# Patient Record
Sex: Male | Born: 1965 | Race: White | Hispanic: No
Health system: Southern US, Community
[De-identification: ages and names within clinical notes are randomized; demographics above are authoritative.]

## PROBLEM LIST (undated history)

## (undated) DIAGNOSIS — K219 Gastro-esophageal reflux disease without esophagitis: Secondary | ICD-10-CM

## (undated) DIAGNOSIS — M1990 Unspecified osteoarthritis, unspecified site: Secondary | ICD-10-CM

## (undated) DIAGNOSIS — Z87442 Personal history of urinary calculi: Secondary | ICD-10-CM

## (undated) DIAGNOSIS — M1711 Unilateral primary osteoarthritis, right knee: Secondary | ICD-10-CM

---

## 2001-05-31 HISTORY — PX: APPENDECTOMY: SHX54

## 2001-12-19 ENCOUNTER — Emergency Department (HOSPITAL_COMMUNITY): Admission: EM | Admit: 2001-12-19 | Discharge: 2001-12-20 | Payer: Self-pay | Admitting: Emergency Medicine

## 2007-02-23 ENCOUNTER — Ambulatory Visit: Payer: Self-pay | Admitting: Gastroenterology

## 2011-08-26 ENCOUNTER — Ambulatory Visit: Payer: Self-pay | Admitting: Orthopedic Surgery

## 2012-10-20 ENCOUNTER — Ambulatory Visit: Payer: Self-pay | Admitting: Orthopedic Surgery

## 2013-03-26 ENCOUNTER — Ambulatory Visit: Payer: Self-pay | Admitting: Physician Assistant

## 2013-05-31 HISTORY — PX: FINGER SURGERY: SHX640

## 2013-06-26 ENCOUNTER — Ambulatory Visit: Payer: Self-pay | Admitting: Orthopedic Surgery

## 2013-07-09 ENCOUNTER — Ambulatory Visit: Payer: Self-pay | Admitting: Orthopedic Surgery

## 2014-04-20 DIAGNOSIS — E7800 Pure hypercholesterolemia, unspecified: Secondary | ICD-10-CM | POA: Insufficient documentation

## 2014-04-20 DIAGNOSIS — K7581 Nonalcoholic steatohepatitis (NASH): Secondary | ICD-10-CM | POA: Insufficient documentation

## 2014-09-21 NOTE — Op Note (Signed)
PATIENT NAME:  Patrick Peterson, Patrick Peterson MR#:  [id number] DATE OF BIRTH:  Jun 11, 1965  DATE OF PROCEDURE:  06/26/2013  PREOPERATIVE DIAGNOSIS: Right index finger foreign body, middle phalanx.   POSTOPERATIVE DIAGNOSIS:  Right index finger foreign body, middle phalanx.   ANESTHESIA: General.   SURGEON: Hessie Knows, M.D.   DESCRIPTION OF PROCEDURE: The patient was brought to the operating room and after adequate anesthesia was obtained, the right arm was prepped and draped in the usual sterile fashion. A tourniquet applied to the upper forearm. After patient identification and timeout procedures were completed, the tourniquet was raised to 250 mmHg. An incision was then made over the dorsum of the middle phalanx with a C-shaped incision. The flap was then sutured down and the extensor tendon split longitudinally directly over the foreign body based on fluoroscopy. A 2.0 mm bur was then used to open up the dorsal cortex and the small metal fragment could be identified. Great care was taken to loosen it from its adjacent tissue and it was then removed with minimal bone loss dorsally. The wound was then irrigated and AlloMatrix bone putty placed in the defect in the phalanx. The foreign body had gone all the way to the flexor tendon surface volarly. After removal, the C-arm showed complete removal of the metal fragment. The tendon was repaired using 6-0 Monocryl, skin closed with 4-0 nylon in a simple interrupted fashion. A digital block with 10 mL of 0.5% Sensorcaine were utilized. Dressing of Xeroform, 4 x 4 and a 1 inch Kling were applied. The patient was then sent to the recovery room in stable condition.   ESTIMATED BLOOD LOSS: Minimal.   COMPLICATIONS: None.   SPECIMENS: None. Foreign body was sent with the patient. It appeared to be the end of a chisel.   TOURNIQUET TIME: 24 minutes at 250 mmHg.  ____________________________ Laurene Footman, MD [id number] D: 06/26/2013 09:56:59 ET T: 06/26/2013  10:25:52 ET JOB#: [id number]  cc: Laurene Footman, MD, <Dictator> Laurene Footman MD ELECTRONICALLY SIGNED 06/26/2013 11:03

## 2016-02-25 DIAGNOSIS — M159 Polyosteoarthritis, unspecified: Secondary | ICD-10-CM | POA: Insufficient documentation

## 2016-07-02 DIAGNOSIS — J40 Bronchitis, not specified as acute or chronic: Secondary | ICD-10-CM | POA: Diagnosis not present

## 2016-07-02 DIAGNOSIS — J329 Chronic sinusitis, unspecified: Secondary | ICD-10-CM | POA: Diagnosis not present

## 2016-08-02 DIAGNOSIS — Z0000 Encounter for general adult medical examination without abnormal findings: Secondary | ICD-10-CM | POA: Diagnosis not present

## 2016-08-02 DIAGNOSIS — K7581 Nonalcoholic steatohepatitis (NASH): Secondary | ICD-10-CM | POA: Diagnosis not present

## 2016-08-02 DIAGNOSIS — E7800 Pure hypercholesterolemia, unspecified: Secondary | ICD-10-CM | POA: Diagnosis not present

## 2016-10-03 DIAGNOSIS — J069 Acute upper respiratory infection, unspecified: Secondary | ICD-10-CM | POA: Diagnosis not present

## 2016-10-03 DIAGNOSIS — J019 Acute sinusitis, unspecified: Secondary | ICD-10-CM | POA: Diagnosis not present

## 2016-10-14 DIAGNOSIS — M25562 Pain in left knee: Secondary | ICD-10-CM | POA: Diagnosis not present

## 2016-12-29 ENCOUNTER — Emergency Department: Payer: 59

## 2016-12-29 ENCOUNTER — Emergency Department
Admission: EM | Admit: 2016-12-29 | Discharge: 2016-12-29 | Disposition: A | Discharge status: Home / Self Care | Payer: 59 | Source: Home / Self Care | Attending: Emergency Medicine | Admitting: Emergency Medicine

## 2016-12-29 DIAGNOSIS — K8020 Calculus of gallbladder without cholecystitis without obstruction: Secondary | ICD-10-CM | POA: Diagnosis not present

## 2016-12-29 DIAGNOSIS — R1013 Epigastric pain: Secondary | ICD-10-CM | POA: Insufficient documentation

## 2016-12-29 DIAGNOSIS — R748 Abnormal levels of other serum enzymes: Secondary | ICD-10-CM | POA: Diagnosis not present

## 2016-12-29 DIAGNOSIS — K8050 Calculus of bile duct without cholangitis or cholecystitis without obstruction: Secondary | ICD-10-CM

## 2016-12-29 DIAGNOSIS — R0602 Shortness of breath: Secondary | ICD-10-CM | POA: Diagnosis not present

## 2016-12-29 DIAGNOSIS — F17200 Nicotine dependence, unspecified, uncomplicated: Secondary | ICD-10-CM | POA: Insufficient documentation

## 2016-12-29 DIAGNOSIS — K8000 Calculus of gallbladder with acute cholecystitis without obstruction: Secondary | ICD-10-CM | POA: Diagnosis not present

## 2016-12-29 LAB — COMPREHENSIVE METABOLIC PANEL
ALBUMIN: 4.4 g/dL (ref 3.5–5.0)
ALK PHOS: 93 U/L (ref 38–126)
ALT: 170 U/L — AB (ref 17–63)
AST: 197 U/L — AB (ref 15–41)
Anion gap: 9 (ref 5–15)
BUN: 14 mg/dL (ref 6–20)
CALCIUM: 10 mg/dL (ref 8.9–10.3)
CHLORIDE: 104 mmol/L (ref 101–111)
CO2: 26 mmol/L (ref 22–32)
CREATININE: 0.98 mg/dL (ref 0.61–1.24)
GFR calc non Af Amer: >60 mL/min (ref >60)
GLUCOSE: 124 mg/dL — AB (ref 65–99)
Potassium: 4.8 mmol/L (ref 3.5–5.1)
SODIUM: 139 mmol/L (ref 135–145)
Total Bilirubin: 6.9 mg/dL — ABNORMAL HIGH (ref 0.3–1.2)
Total Protein: 7.5 g/dL (ref 6.5–8.1)

## 2016-12-29 LAB — CBC
HEMATOCRIT: 50.7 % (ref 40.0–52.0)
HEMOGLOBIN: 17.8 g/dL (ref 13.0–18.0)
MCH: 31.8 pg (ref 26.0–34.0)
MCHC: 35 g/dL (ref 32.0–36.0)
MCV: 90.9 fL (ref 80.0–100.0)
Platelets: 178 10*3/uL (ref 150–440)
RBC: 5.58 MIL/uL (ref 4.40–5.90)
RDW: 13.5 % (ref 11.5–14.5)
WBC: 10 10*3/uL (ref 3.8–10.6)

## 2016-12-29 LAB — LIPASE, BLOOD: Lipase: 39 U/L (ref 11–51)

## 2016-12-29 LAB — TROPONIN I

## 2016-12-29 MED ORDER — MORPHINE SULFATE (PF) 2 MG/ML IV SOLN
INTRAVENOUS | Status: AC
  Administered 2016-12-29: 2 mg via INTRAVENOUS
  Filled 2016-12-29: qty 1

## 2016-12-29 MED ORDER — GI COCKTAIL ~~LOC~~
30.0000 mL | ORAL | Status: AC
  Administered 2016-12-29: 30 mL via ORAL

## 2016-12-29 MED ORDER — ONDANSETRON HCL 4 MG/2ML IJ SOLN
INTRAMUSCULAR | Status: AC
  Filled 2016-12-29: qty 2

## 2016-12-29 MED ORDER — MORPHINE SULFATE (PF) 2 MG/ML IV SOLN
2.0000 mg | Freq: Once | INTRAVENOUS | Status: AC
  Administered 2016-12-29: 2 mg via INTRAVENOUS

## 2016-12-29 MED ORDER — ONDANSETRON HCL 4 MG/2ML IJ SOLN: 4.0000 mg | Freq: Once | INTRAMUSCULAR | Status: DC

## 2016-12-29 MED ORDER — GI COCKTAIL ~~LOC~~
ORAL | Status: AC
  Administered 2016-12-29: 30 mL via ORAL
  Filled 2016-12-29: qty 30

## 2016-12-29 NOTE — ED Notes (Signed)
Pt stating that his pain is now gone and that he feels better. Pt alert and oriented X4, active, cooperative, pt in NAD. RR even and unlabored, color WNL.

## 2016-12-29 NOTE — Discharge Instructions (Signed)
As we discussed please follow-up your doctor on Friday for repeat a few labs. If he develops any further abdominal pain or any fever please return immediately to the emergency department for evaluation.

## 2016-12-29 NOTE — ED Triage Notes (Signed)
Pt arrives to ER via POV c/o epigastric pain that started this AM approx 1 hour after eating. Pt states pain radiates to back. Pt appears to be in pain, grimacing.

## 2016-12-29 NOTE — ED Notes (Signed)
Pt in US

## 2016-12-29 NOTE — ED Notes (Signed)
Pt states had central abd pain that has resolved at this time. Denies N&V&D. Alert, oriented.

## 2016-12-29 NOTE — ED Provider Notes (Signed)
[facility] Emergency Department Provider Note  Time seen: 2:14 PM  I have reviewed the triage vital signs and the nursing notes.   HISTORY  Chief Complaint Abdominal Pain    HPI Patrick Peterson is a 51 y.o. male with no past medical history presents to the emergency department for epigastric pain. According to the patient he ate breakfast this morning around 8:30 9:00 developed significant epigastric pain. States he was getting to the point where he was having some difficulty taking a deep breath due to abdominal pain. Denies any history of epigastric right upper quadrant pain previously. Denies any pain after eating previously. Denies any fever. Denies any nausea or vomiting. Patient received a GI cocktail in triage upon arrival to the emergency department and states complete resolution of his pain after GI cocktail. Denies any regular alcohol use.  History reviewed. No pertinent past medical history.  There are no active problems to display for this patient.   History reviewed. No pertinent surgical history.  Prior to Admission medications   Not on File    No Known Allergies  No family history on file.  Social History Social History  Substance Use Topics  . Smoking status: Current Every Day Smoker  . Smokeless tobacco: Not on file  . Alcohol use Yes    Review of Systems Constitutional: Negative for fever. Cardiovascular: Negative for chest pain. Respiratory: Negative for shortness of breath. Gastrointestinal: Significant epigastric pain, now resolved. Patient states it was aching in nature when it was occurring. Musculoskeletal: Some radiation to the back, now resolved All other ROS negative  ____________________________________________   PHYSICAL EXAM:  VITAL SIGNS: ED Triage Vitals  Enc Vitals Group     BP 12/29/16 1146 (!) 153/87     Pulse Rate 12/29/16 1146 (!) 55     Resp 12/29/16 1146 (!) 24     Temp 12/29/16 1146 97.6 F  (36.4 C)     Temp Source 12/29/16 1146 Oral     SpO2 12/29/16 1146 97 %     Weight 12/29/16 1148 285 lb (129.3 kg)     Height 12/29/16 1148 6\' 3"  (1.905 m)     Head Circumference --      Peak Flow --      Pain Score 12/29/16 1154 10     Pain Loc --      Pain Edu? --      Excl. in [place]? --     Constitutional: Alert and oriented. Well appearing and in no distress. Eyes: Normal exam ENT   Head: Normocephalic and atraumatic   Mouth/Throat: Mucous membranes are moist. Cardiovascular: Normal rate, regular rhythm. No murmur Respiratory: Normal respiratory effort without tachypnea nor retractions. Breath sounds are clear Gastrointestinal: Soft and nontender. No distention.  No abdominal tenderness palpation. Musculoskeletal: Nontender with normal range of motion in all extremities.  Neurologic:  Normal speech and language. No gross focal neurologic deficits  Skin:  Skin is warm, dry and intact.  Psychiatric: Mood and affect are normal.   ____________________________________________    EKG  EKG reviewed and interpreted by myself shows sinus bradycardia at 58 bpm, narrow QRS, normal axis, normal intervals, no ST changes. Normal EKG.  ____________________________________________    RADIOLOGY  Chest x-ray negative  IMPRESSION: Gallstone adherent in neck of gallbladder. No gallbladder wall thickening or pericholecystic fluid.  Increased liver echogenicity, a finding most likely indicative of hepatic steatosis. While no focal liver lesions are evident on this study, it must be  cautioned that the sensitivity of ultrasound for detection of focal liver lesions is diminished in this circumstance.  ____________________________________________   INITIAL IMPRESSION / ASSESSMENT AND PLAN / ED COURSE  Pertinent labs & imaging results that were available during my care of the patient were reviewed by me and considered in my medical decision making (see chart for  details).  Patient presents to the emergency department for epigastric pain which started one hour after eating breakfast this morning. Patient has a completely nontender exam. Takes the pain is completely resolved at this time. Patient's labs show significant LFT elevation as well as a significant elevation of total bilirubin. We will obtain a right upper quadrant ultrasound to further evaluate. Patient is agreeable to this plan. Lipase is normal.  Ultrasound shows a gallstone in the neck of the gallbladder. No gallbladder wall thickening or prior cholecystic fluid. Patient states his abdominal pain is completely gone at this time. Nontender to palpation. Given the patient's elevated bilirubinemia suspect the patient likely had a common duct stone which hopefully his past spontaneously given his complete resolution of abdominal pain. Patient has a primary care doctor he sees a recommended he follow-up tomorrow or Friday for repeat of his labs. If his abdominal pain returns or he spikes a fever he is to return immediately to the emergency department. Patient agreeable to this plan.     ____________________________________________   FINAL CLINICAL IMPRESSION(S) / ED DIAGNOSES  Epigastric pain Biliary colic   Harvest Dark, MD 12/29/16 1551

## 2016-12-29 NOTE — ED Notes (Signed)
ED Provider at bedside. 

## 2016-12-30 ENCOUNTER — Emergency Department: Payer: 59

## 2016-12-30 ENCOUNTER — Inpatient Hospital Stay
Admission: EM | Admit: 2016-12-30 | Discharge: 2017-01-03 | DRG: 419 | Disposition: A | Discharge status: Home / Self Care | Payer: 59 | Attending: Surgery | Admitting: Surgery

## 2016-12-30 ENCOUNTER — Encounter: Payer: Self-pay | Admitting: Emergency Medicine

## 2016-12-30 DIAGNOSIS — R17 Unspecified jaundice: Secondary | ICD-10-CM | POA: Diagnosis not present

## 2016-12-30 DIAGNOSIS — R0602 Shortness of breath: Secondary | ICD-10-CM | POA: Diagnosis not present

## 2016-12-30 DIAGNOSIS — K760 Fatty (change of) liver, not elsewhere classified: Secondary | ICD-10-CM | POA: Diagnosis present

## 2016-12-30 DIAGNOSIS — R7401 Elevation of levels of liver transaminase levels: Secondary | ICD-10-CM | POA: Diagnosis present

## 2016-12-30 DIAGNOSIS — K8020 Calculus of gallbladder without cholecystitis without obstruction: Secondary | ICD-10-CM | POA: Diagnosis not present

## 2016-12-30 DIAGNOSIS — R740 Nonspecific elevation of levels of transaminase and lactic acid dehydrogenase [LDH]: Secondary | ICD-10-CM | POA: Diagnosis present

## 2016-12-30 DIAGNOSIS — F17200 Nicotine dependence, unspecified, uncomplicated: Secondary | ICD-10-CM | POA: Diagnosis present

## 2016-12-30 DIAGNOSIS — K810 Acute cholecystitis: Secondary | ICD-10-CM | POA: Diagnosis not present

## 2016-12-30 DIAGNOSIS — K828 Other specified diseases of gallbladder: Secondary | ICD-10-CM | POA: Diagnosis present

## 2016-12-30 DIAGNOSIS — R109 Unspecified abdominal pain: Secondary | ICD-10-CM | POA: Diagnosis not present

## 2016-12-30 DIAGNOSIS — R1013 Epigastric pain: Secondary | ICD-10-CM | POA: Diagnosis not present

## 2016-12-30 DIAGNOSIS — M170 Bilateral primary osteoarthritis of knee: Secondary | ICD-10-CM | POA: Diagnosis present

## 2016-12-30 DIAGNOSIS — Z87442 Personal history of urinary calculi: Secondary | ICD-10-CM | POA: Diagnosis not present

## 2016-12-30 DIAGNOSIS — R1011 Right upper quadrant pain: Secondary | ICD-10-CM

## 2016-12-30 DIAGNOSIS — K8010 Calculus of gallbladder with chronic cholecystitis without obstruction: Secondary | ICD-10-CM | POA: Diagnosis not present

## 2016-12-30 DIAGNOSIS — R930 Abnormal findings on diagnostic imaging of skull and head, not elsewhere classified: Secondary | ICD-10-CM | POA: Diagnosis not present

## 2016-12-30 DIAGNOSIS — R748 Abnormal levels of other serum enzymes: Secondary | ICD-10-CM | POA: Diagnosis not present

## 2016-12-30 DIAGNOSIS — K8000 Calculus of gallbladder with acute cholecystitis without obstruction: Secondary | ICD-10-CM | POA: Diagnosis not present

## 2016-12-30 DIAGNOSIS — R1010 Upper abdominal pain, unspecified: Secondary | ICD-10-CM

## 2016-12-30 LAB — CBC
HCT: 47.3 % (ref 40.0–52.0)
HEMOGLOBIN: 16.3 g/dL (ref 13.0–18.0)
MCH: 31.1 pg (ref 26.0–34.0)
MCHC: 34.5 g/dL (ref 32.0–36.0)
MCV: 90.3 fL (ref 80.0–100.0)
PLATELETS: 175 10*3/uL (ref 150–440)
RBC: 5.24 MIL/uL (ref 4.40–5.90)
RDW: 13.6 % (ref 11.5–14.5)
WBC: 7.6 10*3/uL (ref 3.8–10.6)

## 2016-12-30 LAB — COMPREHENSIVE METABOLIC PANEL
ALK PHOS: 108 U/L (ref 38–126)
ALT: 253 U/L — ABNORMAL HIGH (ref 17–63)
ANION GAP: 8 (ref 5–15)
AST: 213 U/L — ABNORMAL HIGH (ref 15–41)
Albumin: 4.3 g/dL (ref 3.5–5.0)
BUN: 14 mg/dL (ref 6–20)
CALCIUM: 9.3 mg/dL (ref 8.9–10.3)
CHLORIDE: 105 mmol/L (ref 101–111)
CO2: 24 mmol/L (ref 22–32)
CREATININE: 0.86 mg/dL (ref 0.61–1.24)
Glucose, Bld: 117 mg/dL — ABNORMAL HIGH (ref 65–99)
Potassium: 3.8 mmol/L (ref 3.5–5.1)
SODIUM: 137 mmol/L (ref 135–145)
Total Bilirubin: 8.7 mg/dL — ABNORMAL HIGH (ref 0.3–1.2)
Total Protein: 7.4 g/dL (ref 6.5–8.1)

## 2016-12-30 LAB — URINALYSIS, COMPLETE (UACMP) WITH MICROSCOPIC
BILIRUBIN URINE: NEGATIVE
GLUCOSE, UA: NEGATIVE mg/dL
HGB URINE DIPSTICK: NEGATIVE
KETONES UR: NEGATIVE mg/dL
LEUKOCYTES UA: NEGATIVE
Nitrite: NEGATIVE
PH: 7 (ref 5.0–8.0)
PROTEIN: NEGATIVE mg/dL
Specific Gravity, Urine: 1.012 (ref 1.005–1.030)
Squamous Epithelial / LPF: NONE SEEN

## 2016-12-30 LAB — PROTIME-INR
INR: 1.02
PROTHROMBIN TIME: 13.4 s (ref 11.4–15.2)

## 2016-12-30 LAB — LIPASE, BLOOD: LIPASE: 49 U/L (ref 11–51)

## 2016-12-30 LAB — ACETAMINOPHEN LEVEL

## 2016-12-30 LAB — TROPONIN I: Troponin I: <0.03 ng/mL (ref <0.03)

## 2016-12-30 MED ORDER — SODIUM CHLORIDE 0.9 % IV SOLN
INTRAVENOUS | Status: DC
  Administered 2016-12-30: 23:00:00 via INTRAVENOUS

## 2016-12-30 MED ORDER — HEPARIN SODIUM (PORCINE) 5000 UNIT/ML IJ SOLN
5000.0000 [IU] | Freq: Three times a day (TID) | INTRAMUSCULAR | Status: DC
  Administered 2016-12-30 – 2017-01-02 (×6): 5000 [IU] via SUBCUTANEOUS
  Filled 2016-12-30 (×6): qty 1

## 2016-12-30 MED ORDER — BISACODYL 5 MG PO TBEC: 5.0000 mg | DELAYED_RELEASE_TABLET | Freq: Every day | ORAL | Status: DC | PRN

## 2016-12-30 MED ORDER — ACETAMINOPHEN 650 MG RE SUPP: 650.0000 mg | Freq: Four times a day (QID) | RECTAL | Status: DC | PRN

## 2016-12-30 MED ORDER — HYDROCODONE-ACETAMINOPHEN 5-325 MG PO TABS
1.0000 | ORAL_TABLET | ORAL | Status: DC | PRN
  Administered 2017-01-02: 1 via ORAL
  Administered 2017-01-02: 2 via ORAL
  Administered 2017-01-02 (×2): 1 via ORAL
  Administered 2017-01-02 – 2017-01-03 (×2): 2 via ORAL
  Filled 2016-12-30: qty 2
  Filled 2016-12-30: qty 1
  Filled 2016-12-30: qty 2
  Filled 2016-12-30: qty 1
  Filled 2016-12-30: qty 2
  Filled 2016-12-30: qty 1

## 2016-12-30 MED ORDER — ONDANSETRON HCL 4 MG/2ML IJ SOLN
4.0000 mg | Freq: Four times a day (QID) | INTRAMUSCULAR | Status: DC | PRN
  Administered 2017-01-02 (×2): 4 mg via INTRAVENOUS
  Filled 2016-12-30: qty 2

## 2016-12-30 MED ORDER — SODIUM CHLORIDE 0.9 % IV BOLUS (SEPSIS)
1000.0000 mL | Freq: Once | INTRAVENOUS | Status: AC
  Administered 2016-12-30: 1000 mL via INTRAVENOUS

## 2016-12-30 MED ORDER — ONDANSETRON HCL 4 MG PO TABS: 4.0000 mg | ORAL_TABLET | Freq: Four times a day (QID) | ORAL | Status: DC | PRN

## 2016-12-30 MED ORDER — DOCUSATE SODIUM 100 MG PO CAPS
100.0000 mg | ORAL_CAPSULE | Freq: Two times a day (BID) | ORAL | Status: DC
  Administered 2016-12-30 – 2017-01-02 (×3): 100 mg via ORAL
  Filled 2016-12-30 (×4): qty 1

## 2016-12-30 MED ORDER — GADOBENATE DIMEGLUMINE 529 MG/ML IV SOLN
20.0000 mL | Freq: Once | INTRAVENOUS | Status: AC | PRN
  Administered 2016-12-30: 20 mL via INTRAVENOUS

## 2016-12-30 MED ORDER — ACETAMINOPHEN 325 MG PO TABS
650.0000 mg | ORAL_TABLET | Freq: Four times a day (QID) | ORAL | Status: DC | PRN
Start: 2016-12-30 — End: 2017-01-03

## 2016-12-30 NOTE — ED Notes (Addendum)
Pt has pain in upper abdomen.  Pt reports nausea.   No v/d.  Pt was seen here yesterday with similar sx.  Pt also reports dark urine.    Pt reports he has gallstones.

## 2016-12-30 NOTE — ED Notes (Signed)
Report off to kala rn 

## 2016-12-30 NOTE — ED Provider Notes (Signed)
Mr 3d Recon At Scanner  Result Date: 12/30/2016 CLINICAL DATA:  Epigastric/ upper abdominal pain, cholelithiasis EXAM: MRI ABDOMEN WITHOUT AND WITH CONTRAST (INCLUDING MRCP) TECHNIQUE: Multiplanar multisequence MR imaging of the abdomen was performed both before and after the administration of intravenous contrast. Heavily T2-weighted images of the biliary and pancreatic ducts were obtained, and three-dimensional MRCP images were rendered by post processing. CONTRAST:  52mL MULTIHANCE GADOBENATE DIMEGLUMINE 529 MG/ML IV SOLN COMPARISON:  Right upper quadrant ultrasound dated 12/30/2016 FINDINGS: Lower chest: Lung bases are clear. Hepatobiliary: Moderate hepatic steatosis. No suspicious/enhancing hepatic lesions. Layering small gallstones measuring up to 10 mm (series 4/ image 15). No associated inflammatory changes. No intrahepatic or extrahepatic ductal dilatation. Common duct measures 5 mm. No choledocholithiasis is seen. Pancreas:  Within normal limits. Spleen:  Within normal limits. Adrenals/Urinary Tract:  Adrenal glands are within normal limits. Kidneys are within normal limits.  No hydronephrosis. Stomach/Bowel: Stomach is within normal limits. Visualized bowel is unremarkable. Vascular/Lymphatic:  No evidence of abdominal aortic aneurysm. No suspicious abdominal lymphadenopathy. Other:  No abdominal ascites. Musculoskeletal: No focal osseous lesions IMPRESSION: Cholelithiasis, without associated inflammatory changes. No intrahepatic or extrahepatic ductal dilatation. Common duct measures 5 mm. No choledocholithiasis is seen. Moderate hepatic steatosis. Electronically Signed   By: Julian Hy M.D.   On: 12/30/2016 21:14   Mr Abdomen Mrcp Moise Boring Contast  Result Date: 12/30/2016 CLINICAL DATA:  Epigastric/ upper abdominal pain, cholelithiasis EXAM: MRI ABDOMEN WITHOUT AND WITH CONTRAST (INCLUDING MRCP) TECHNIQUE: Multiplanar multisequence MR imaging of the abdomen was performed both before and after the  administration of intravenous contrast. Heavily T2-weighted images of the biliary and pancreatic ducts were obtained, and three-dimensional MRCP images were rendered by post processing. CONTRAST:  81mL MULTIHANCE GADOBENATE DIMEGLUMINE 529 MG/ML IV SOLN COMPARISON:  Right upper quadrant ultrasound dated 12/30/2016 FINDINGS: Lower chest: Lung bases are clear. Hepatobiliary: Moderate hepatic steatosis. No suspicious/enhancing hepatic lesions. Layering small gallstones measuring up to 10 mm (series 4/ image 15). No associated inflammatory changes. No intrahepatic or extrahepatic ductal dilatation. Common duct measures 5 mm. No choledocholithiasis is seen. Pancreas:  Within normal limits. Spleen:  Within normal limits. Adrenals/Urinary Tract:  Adrenal glands are within normal limits. Kidneys are within normal limits.  No hydronephrosis. Stomach/Bowel: Stomach is within normal limits. Visualized bowel is unremarkable. Vascular/Lymphatic:  No evidence of abdominal aortic aneurysm. No suspicious abdominal lymphadenopathy. Other:  No abdominal ascites. Musculoskeletal: No focal osseous lesions IMPRESSION: Cholelithiasis, without associated inflammatory changes. No intrahepatic or extrahepatic ductal dilatation. Common duct measures 5 mm. No choledocholithiasis is seen. Moderate hepatic steatosis. Electronically Signed   By: Julian Hy M.D.   On: 12/30/2016 21:14   US Abdomen Limited Ruq  Result Date: 12/30/2016 CLINICAL DATA:  Right upper quadrant pain x3 days EXAM: ULTRASOUND ABDOMEN LIMITED RIGHT UPPER QUADRANT COMPARISON:  12/29/2016 FINDINGS: Gallbladder: Multiple gallstones measuring up to 11 mm. No gallbladder wall thickening or pericholecystic fluid. Negative sonographic Murphy's sign. Common bile duct: Diameter: 5 mm Liver: Hyperechoic hepatic parenchyma.  No focal hepatic lesion is seen. IMPRESSION: Cholelithiasis, without associated inflammatory changes to suggest acute cholecystitis. Suspected hepatic  steatosis. Electronically Signed   By: Julian Hy M.D.   On: 12/30/2016 17:29       ----------------------------------------- 9:39 PM on 12/30/2016 -----------------------------------------  Patient is awake and alert. MRCP findings reviewed. Discussed with patient and he is agreeable with plan for admission here. I spoke with Dr. Vicente Males gastroenterology who recommends admission at [facility] and he will see in  consult in the morning. In addition spoke with Dr. Adonis Huguenin surgery, recommends admission here in perform surgery consult but the patient is not a surgical candidate at this time. No indication for transfer or ERCP per Dr. Vicente Males at this time. Recommends admit and add INR for now.     Delman Kitten, MD 12/30/16 2140

## 2016-12-30 NOTE — ED Triage Notes (Signed)
Pt here for epigastric/upper abdominal pain. Here yesterday and was told it was his gallbladder.  Pain was worse after eating so has not eaten.  No vomiting. Has had nausea.  Did start to see hematuria. Elevated liver function yesterday.  No fevers.

## 2016-12-30 NOTE — ED Provider Notes (Signed)
[facility] Emergency Department Provider Note  ____________________________________________   First MD Initiated Contact with Patient 12/30/16 1614     (approximate)  I have reviewed the triage vital signs and the nursing notes.   HISTORY  Chief Complaint Abdominal Pain   HPI Patrick Peterson is a 51 y.o. male without any chronic medical problems was presenting to the emergency department today with epigastric abdominal pain. He was seen her yesterday and found to have elevated bilirubin in addition to transaminases. He had an ultrasound of his right upper quadrant which showed a single stone adhered to the gallbladder neck. However, he was pain-free upon reevaluation yesterday and was discharged home for outpatient follow-up. However, today after eating lunch the patient began to have pain once again with nausea. He return to the emergency department for further evaluation. He says that he now remembers several weeks ago having pain after eating which was similar which likely represent at the onset of his issue.  Also today complaining of fatigue colored urine.   History reviewed. No pertinent past medical history.  There are no active problems to display for this patient.   History reviewed. No pertinent surgical history.  Prior to Admission medications   Not on File    Allergies Patient has no known allergies.  History reviewed. No pertinent family history.  Social History Social History  Substance Use Topics  . Smoking status: Current Every Day Smoker  . Smokeless tobacco: Never Used  . Alcohol use Yes    Review of Systems  Constitutional: No fever/chills Eyes: No visual changes. ENT: No sore throat. Cardiovascular: Denies chest pain. Respiratory: Denies shortness of breath. Gastrointestinal: no vomiting.  No diarrhea.  No constipation. Genitourinary: as above Musculoskeletal: Negative for back pain. Skin: Negative for  rash. Neurological: Negative for headaches, focal weakness or numbness.   ____________________________________________   PHYSICAL EXAM:  VITAL SIGNS: ED Triage Vitals  Enc Vitals Group     BP 12/30/16 1606 (!) 153/89     Pulse Rate 12/30/16 1606 69     Resp 12/30/16 1606 18     Temp 12/30/16 1606 98.8 F (37.1 C)     Temp Source 12/30/16 1606 Oral     SpO2 12/30/16 1606 97 %     Weight 12/30/16 1606 285 lb (129.3 kg)     Height 12/30/16 1606 6\' 3"  (1.905 m)     Head Circumference --      Peak Flow --      Pain Score 12/30/16 1605 2     Pain Loc --      Pain Edu? --      Excl. in [place]? --     Constitutional: Alert and oriented. Well appearing and in no acute distress. Eyes: Conjunctivae are icteric  Head: Atraumatic. Nose: No congestion/rhinnorhea. Mouth/Throat: Mucous membranes are moist.  Neck: No stridor.   Cardiovascular: Normal rate, regular rhythm. Grossly normal heart sounds.   Respiratory: Normal respiratory effort.  No retractions. Lungs CTAB. Gastrointestinal: Soft and nontender. No distention. No CVA tenderness.Negative Murphy sign. Musculoskeletal: No lower extremity tenderness nor edema.  No joint effusions. Neurologic:  Normal speech and language. No gross focal neurologic deficits are appreciated. Skin:  Skin is warm, dry and intact. No rash noted. Psychiatric: Mood and affect are normal. Speech and behavior are normal.  ____________________________________________   LABS (all labs ordered are listed, but only abnormal results are displayed)  Labs Reviewed  COMPREHENSIVE METABOLIC PANEL - Abnormal; Notable for  the following:       Result Value   Glucose, Bld 117 (*)    AST 213 (*)    ALT 253 (*)    Total Bilirubin 8.7 (*)    All other components within normal limits  URINALYSIS, COMPLETE (UACMP) WITH MICROSCOPIC - Abnormal; Notable for the following:    Color, Urine AMBER (*)    APPearance CLEAR (*)    Bacteria, UA RARE (*)    All other  components within normal limits  LIPASE, BLOOD  CBC  TROPONIN I   ____________________________________________  EKG   ____________________________________________  RADIOLOGY  Ultrasound with cholelithiasis but without any associated inflammatory changes. Normal common bile duct. Hepatic steatosis. ____________________________________________   PROCEDURES  Procedure(s) performed:   Procedures  Critical Care performed:   ____________________________________________   INITIAL IMPRESSION / ASSESSMENT AND PLAN / ED COURSE  Pertinent labs & imaging results that were available during my care of the patient were reviewed by me and considered in my medical decision making (see chart for details).  ----------------------------------------- 8:57 PM on 12/30/2016 -----------------------------------------  Patient continues to be pain-free. Discussed the case with surgery, Dr. Hampton Abbot and Adonis Huguenin, who recommended that as long as the MRCP is negative that the patient will need to be admitted to medicine for medical workup and they will follow as consultants.  Plan is that the patient has a negative MRCP that he will be admitted to the hospital here. However, there is a stone found in the common bile duct and the patient will need to be transferred. Patient aware of the plan. Signed out to Dr. Jacqualine Code.        ____________________________________________   FINAL CLINICAL IMPRESSION(S) / ED DIAGNOSES  Final diagnoses:  RUQ pain  Transaminitis. Hyperbilirubinemia.    NEW MEDICATIONS STARTED DURING THIS VISIT:  New Prescriptions   No medications on file     Note:  This document was prepared using Dragon voice recognition software and may include unintentional dictation errors.     Orbie Pyo, MD 12/30/16 2059

## 2016-12-30 NOTE — ED Notes (Signed)
Return from u/s  Pt alert   Family with pt.

## 2016-12-30 NOTE — H&P (Signed)
[facility] NAME: Patrick Peterson    MR#:  [id number]  DATE OF BIRTH:  Dec 24, 1965  DATE OF ADMISSION:  12/30/2016  PRIMARY CARE PHYSICIAN: Katheren Shams   REQUESTING/REFERRING PHYSICIAN: Delman Kitten, MD  CHIEF COMPLAINT:   Chief Complaint  Patient presents with  . Abdominal Pain    HISTORY OF PRESENT ILLNESS:  Patrick Peterson  is a 51 y.o. male with No known medical problem is being admitted for abdominal pain.  Patient was here yesterday in the emergency department was noted to have elevated bilirubin.  Head ultrasound of his right upper quadrant which showed gallstone.  He was discharged home with recommended outpatient follow-up.  Patient continued to have pain, especially after any kind of meal associated with nausea.  He decided to come back to the emergency department.  Of note, he is total bilirubin did go up from 6.9->8.7.  He underwent MRCP which shows cholelithiasis, no ductal dilatation.  Moderate hepatic steatosis.  He to his ongoing symptoms and elevated bilirubin.  He is being admitted for further evaluation and management.  PAST MEDICAL HISTORY:  History reviewed. No pertinent past medical history. Knee arthritis PAST SURGICAL HISTORY:  History reviewed. No pertinent surgical history. Appendectomy 12 years ago SOCIAL HISTORY:   Social History  Substance Use Topics  . Smoking status: Current Every Day Smoker  . Smokeless tobacco: Never Used  . Alcohol use Yes  Works in a [employer] in [place].  Ports lots of passive exposure to smoke.  FAMILY HISTORY:  History reviewed. No pertinent family history. Maternal grandmother had some kind of liver disease DRUG ALLERGIES:  No Known Allergies  REVIEW OF SYSTEMS:   Review of Systems  Constitutional: Negative for chills, fever and weight loss.  HENT: Negative for nosebleeds and sore throat.   Eyes: Negative for blurred vision.  Respiratory:  Negative for cough, shortness of breath and wheezing.   Cardiovascular: Negative for chest pain, orthopnea, leg swelling and PND.  Gastrointestinal: Positive for abdominal pain and nausea. Negative for constipation, diarrhea, heartburn and vomiting.  Genitourinary: Negative for dysuria and urgency.  Musculoskeletal: Negative for back pain.  Skin: Negative for rash.  Neurological: Negative for dizziness, speech change, focal weakness and headaches.  Endo/Heme/Allergies: Does not bruise/bleed easily.  Psychiatric/Behavioral: Negative for depression.    MEDICATIONS AT HOME:   Prior to Admission medications   Not on File  Takes Mobic as needed for arthritis    VITAL SIGNS:  Blood pressure (!) 143/96, pulse 62, temperature 98.8 F (37.1 C), temperature source Oral, resp. rate 16, height 6\' 3"  (1.905 m), weight 129.3 kg (285 lb), SpO2 95 %.  PHYSICAL EXAMINATION:  Physical Exam  GENERAL:  51 y.o.-year-old patient lying in the bed with no acute distress.  EYES: Pupils equal, round, reactive to light and accommodation. No scleral icterus. Extraocular muscles intact.  HEENT: Head atraumatic, normocephalic. Oropharynx and nasopharynx clear.  NECK:  Supple, no jugular venous distention. No thyroid enlargement, no tenderness.  LUNGS: Normal breath sounds bilaterally, no wheezing, rales,rhonchi or crepitation. No use of accessory muscles of respiration.  CARDIOVASCULAR: S1, S2 normal. No murmurs, rubs, or gallops.  ABDOMEN: Soft, nontender, nondistended. Bowel sounds present. No organomegaly or mass.  EXTREMITIES: No pedal edema, cyanosis, or clubbing.  NEUROLOGIC: Cranial nerves II through XII are intact. Muscle strength 5/5 in all extremities. Sensation intact. Gait not checked.  PSYCHIATRIC: The patient is alert and oriented x 3.  SKIN: No  obvious rash, lesion, or ulcer.   LABORATORY PANEL:   CBC  Recent Labs Lab 12/30/16 1605  WBC 7.6  HGB 16.3  HCT 47.3  PLT 175    ------------------------------------------------------------------------------------------------------------------  Chemistries   Recent Labs Lab 12/30/16 1605  NA 137  K 3.8  CL 105  CO2 24  GLUCOSE 117*  BUN 14  CREATININE 0.86  CALCIUM 9.3  AST 213*  ALT 253*  ALKPHOS 108  BILITOT 8.7*   ------------------------------------------------------------------------------------------------------------------  Cardiac Enzymes  Recent Labs Lab 12/30/16 1605  TROPONINI <0.03   ------------------------------------------------------------------------------------------------------------------  RADIOLOGY:  Dg Chest 2 View  Result Date: 12/29/2016 CLINICAL DATA:  Epigastric pain in shortness of breath. EXAM: CHEST  2 VIEW COMPARISON:  CT abdomen and pelvis 03/26/2013 FINDINGS: The cardiomediastinal silhouette is within normal limits. The lungs are slightly hypoinflated. No airspace consolidation, edema, pleural effusion, or pneumothorax is identified. Thoracic spondylosis is noted. Two subcentimeter round calcifications in the right upper abdomen corresponds to previously demonstrated gallstones. IMPRESSION: No active cardiopulmonary disease. Electronically Signed   By: Logan Bores M.D.   On: 12/29/2016 13:01   Mr 3d Recon At Scanner  Result Date: 12/30/2016 CLINICAL DATA:  Epigastric/ upper abdominal pain, cholelithiasis EXAM: MRI ABDOMEN WITHOUT AND WITH CONTRAST (INCLUDING MRCP) TECHNIQUE: Multiplanar multisequence MR imaging of the abdomen was performed both before and after the administration of intravenous contrast. Heavily T2-weighted images of the biliary and pancreatic ducts were obtained, and three-dimensional MRCP images were rendered by post processing. CONTRAST:  13mL MULTIHANCE GADOBENATE DIMEGLUMINE 529 MG/ML IV SOLN COMPARISON:  Right upper quadrant ultrasound dated 12/30/2016 FINDINGS: Lower chest: Lung bases are clear. Hepatobiliary: Moderate hepatic steatosis. No  suspicious/enhancing hepatic lesions. Layering small gallstones measuring up to 10 mm (series 4/ image 15). No associated inflammatory changes. No intrahepatic or extrahepatic ductal dilatation. Common duct measures 5 mm. No choledocholithiasis is seen. Pancreas:  Within normal limits. Spleen:  Within normal limits. Adrenals/Urinary Tract:  Adrenal glands are within normal limits. Kidneys are within normal limits.  No hydronephrosis. Stomach/Bowel: Stomach is within normal limits. Visualized bowel is unremarkable. Vascular/Lymphatic:  No evidence of abdominal aortic aneurysm. No suspicious abdominal lymphadenopathy. Other:  No abdominal ascites. Musculoskeletal: No focal osseous lesions IMPRESSION: Cholelithiasis, without associated inflammatory changes. No intrahepatic or extrahepatic ductal dilatation. Common duct measures 5 mm. No choledocholithiasis is seen. Moderate hepatic steatosis. Electronically Signed   By: Julian Hy M.D.   On: 12/30/2016 21:14   Mr Abdomen Mrcp Moise Boring Contast  Result Date: 12/30/2016 CLINICAL DATA:  Epigastric/ upper abdominal pain, cholelithiasis EXAM: MRI ABDOMEN WITHOUT AND WITH CONTRAST (INCLUDING MRCP) TECHNIQUE: Multiplanar multisequence MR imaging of the abdomen was performed both before and after the administration of intravenous contrast. Heavily T2-weighted images of the biliary and pancreatic ducts were obtained, and three-dimensional MRCP images were rendered by post processing. CONTRAST:  63mL MULTIHANCE GADOBENATE DIMEGLUMINE 529 MG/ML IV SOLN COMPARISON:  Right upper quadrant ultrasound dated 12/30/2016 FINDINGS: Lower chest: Lung bases are clear. Hepatobiliary: Moderate hepatic steatosis. No suspicious/enhancing hepatic lesions. Layering small gallstones measuring up to 10 mm (series 4/ image 15). No associated inflammatory changes. No intrahepatic or extrahepatic ductal dilatation. Common duct measures 5 mm. No choledocholithiasis is seen. Pancreas:  Within  normal limits. Spleen:  Within normal limits. Adrenals/Urinary Tract:  Adrenal glands are within normal limits. Kidneys are within normal limits.  No hydronephrosis. Stomach/Bowel: Stomach is within normal limits. Visualized bowel is unremarkable. Vascular/Lymphatic:  No evidence of abdominal aortic aneurysm. No suspicious abdominal lymphadenopathy.  Other:  No abdominal ascites. Musculoskeletal: No focal osseous lesions IMPRESSION: Cholelithiasis, without associated inflammatory changes. No intrahepatic or extrahepatic ductal dilatation. Common duct measures 5 mm. No choledocholithiasis is seen. Moderate hepatic steatosis. Electronically Signed   By: Julian Hy M.D.   On: 12/30/2016 21:14   US Abdomen Limited Ruq  Result Date: 12/30/2016 CLINICAL DATA:  Right upper quadrant pain x3 days EXAM: ULTRASOUND ABDOMEN LIMITED RIGHT UPPER QUADRANT COMPARISON:  12/29/2016 FINDINGS: Gallbladder: Multiple gallstones measuring up to 11 mm. No gallbladder wall thickening or pericholecystic fluid. Negative sonographic Murphy's sign. Common bile duct: Diameter: 5 mm Liver: Hyperechoic hepatic parenchyma.  No focal hepatic lesion is seen. IMPRESSION: Cholelithiasis, without associated inflammatory changes to suggest acute cholecystitis. Suspected hepatic steatosis. Electronically Signed   By: Julian Hy M.D.   On: 12/30/2016 17:29   US Abdomen Limited Ruq  Result Date: 12/29/2016 CLINICAL DATA:  Elevated liver enzymes and upper abdominal pain EXAM: ULTRASOUND ABDOMEN LIMITED RIGHT UPPER QUADRANT COMPARISON:  None. FINDINGS: Gallbladder: There is a 9 mm calculus adherent in the neck of the gallbladder. There is no gallbladder wall thickening or pericholecystic fluid. No sonographic Murphy sign noted by sonographer. Common bile duct: Diameter: 6 mm. No intrahepatic or extrahepatic biliary duct dilatation. Liver: No focal lesion identified. Liver echogenicity increased diffusely. Flow in the portal vein appears in  the anatomic direction. IMPRESSION: Gallstone adherent in neck of gallbladder. No gallbladder wall thickening or pericholecystic fluid. Increased liver echogenicity, a finding most likely indicative of hepatic steatosis. While no focal liver lesions are evident on this study, it must be cautioned that the sensitivity of ultrasound for detection of focal liver lesions is diminished in this circumstance. Electronically Signed   By: Lowella Grip III M.D.   On: 12/29/2016 15:19      IMPRESSION AND PLAN:   51 year old white male being admitted for abdominal pain and elevated total bilirubin.    * Acute transaminitis - primary liver disease versus gallbladder disease - We will get a GI and surgical consultation - Case discussed with Dr. Vicente Males and Dr. Adonis Huguenin - We will request HIDA scan in the morning - Check hepatitis panel, Tylenol level and pro time  * Elevated total bilirubin - Workup as above  * Abdominal pain and nausea - Unknown etiology.  Could be related to gallbladder/gallstones.  As his pain is only when he eats  * DJD of the knees - No acute pain, outpatient evaluation       All the records are reviewed and case discussed with ED provider. Management plans discussed with the patient, family and they are in agreement.  CODE STATUS: Full code   TOTAL TIME TAKING CARE OF THIS PATIENT: 45 minutes.    Max Sane M.D on 12/30/2016 at 10:15 PM  Between 7am to 6pm - Pager - [phone]  After 6pm go to [email or web address] - Proofreader  [facility]  Office  [phone]  CC: Primary care physician; Katheren Shams   Note: This dictation was prepared with Dragon dictation along with smaller phrase technology. Any transcriptional errors that result from this process are unintentional.

## 2016-12-31 ENCOUNTER — Inpatient Hospital Stay: Payer: 59

## 2016-12-31 ENCOUNTER — Encounter: Payer: Self-pay | Admitting: Radiology

## 2016-12-31 DIAGNOSIS — K8020 Calculus of gallbladder without cholecystitis without obstruction: Secondary | ICD-10-CM

## 2016-12-31 DIAGNOSIS — R740 Nonspecific elevation of levels of transaminase and lactic acid dehydrogenase [LDH]: Secondary | ICD-10-CM

## 2016-12-31 LAB — GLUCOSE, CAPILLARY: Glucose-Capillary: 106 mg/dL — ABNORMAL HIGH (ref 65–99)

## 2016-12-31 LAB — HEPATIC FUNCTION PANEL
ALBUMIN: 3.7 g/dL (ref 3.5–5.0)
ALT: 239 U/L — AB (ref 17–63)
AST: 180 U/L — AB (ref 15–41)
Alkaline Phosphatase: 107 U/L (ref 38–126)
Bilirubin, Direct: 3.3 mg/dL — ABNORMAL HIGH (ref 0.1–0.5)
Indirect Bilirubin: 3.5 mg/dL — ABNORMAL HIGH (ref 0.3–0.9)
TOTAL PROTEIN: 6.6 g/dL (ref 6.5–8.1)
Total Bilirubin: 6.8 mg/dL — ABNORMAL HIGH (ref 0.3–1.2)

## 2016-12-31 LAB — BASIC METABOLIC PANEL
ANION GAP: 7 (ref 5–15)
BUN: 12 mg/dL (ref 6–20)
CALCIUM: 8.9 mg/dL (ref 8.9–10.3)
CHLORIDE: 108 mmol/L (ref 101–111)
CO2: 25 mmol/L (ref 22–32)
Creatinine, Ser: 0.87 mg/dL (ref 0.61–1.24)
GFR calc non Af Amer: >60 mL/min (ref >60)
GLUCOSE: 99 mg/dL (ref 65–99)
POTASSIUM: 4 mmol/L (ref 3.5–5.1)
Sodium: 140 mmol/L (ref 135–145)

## 2016-12-31 LAB — CBC
HEMATOCRIT: 45 % (ref 40.0–52.0)
HEMOGLOBIN: 15.8 g/dL (ref 13.0–18.0)
MCH: 31.9 pg (ref 26.0–34.0)
MCHC: 35.1 g/dL (ref 32.0–36.0)
MCV: 91.1 fL (ref 80.0–100.0)
Platelets: 149 10*3/uL — ABNORMAL LOW (ref 150–440)
RBC: 4.94 MIL/uL (ref 4.40–5.90)
RDW: 13.8 % (ref 11.5–14.5)
WBC: 7.4 10*3/uL (ref 3.8–10.6)

## 2016-12-31 LAB — IRON AND TIBC
IRON: 123 ug/dL (ref 45–182)
Saturation Ratios: 36 % (ref 17.9–39.5)
TIBC: 344 ug/dL (ref 250–450)
UIBC: 221 ug/dL

## 2016-12-31 LAB — PROTIME-INR
INR: 1.01
Prothrombin Time: 13.3 seconds (ref 11.4–15.2)

## 2016-12-31 LAB — APTT: APTT: 26 s (ref 24–36)

## 2016-12-31 LAB — FERRITIN: Ferritin: 810 ng/mL — ABNORMAL HIGH (ref 24–336)

## 2016-12-31 MED ORDER — TECHNETIUM TC 99M MEBROFENIN IV KIT
5.0000 | PACK | Freq: Once | INTRAVENOUS | Status: AC | PRN
  Administered 2016-12-31: 5.13 via INTRAVENOUS

## 2016-12-31 NOTE — Consult Note (Signed)
Jonathon Bellows MD, MRCP(U.K) [address]  [place]  [place], [place] [address]  Main: [phone]  Fax: [phone]  Consultation  Referring Provider:    Dr Manuella Ghazi Primary Care Physician:  Katheren Shams Primary Gastroenterologist:  None          Reason for Consultation:     Abnormal LFT's  Date of Admission:  12/30/2016 Date of Consultation:  12/31/2016         HPI:   Patrick Peterson is a 51 y.o. male presentd to the ER yesterday with abdominal pain. He was doing well with no pain till Tuesday , when about 2 hours after supper he developed severe epigastric pain non radiating that lasted about 30 mins and resolved spontaneously. The next day in the morning he had a bacon sandwich and within 30 mins of eating developed severe epigastric pain similar to the day prior and lasted till he came into the ER. Denies any nausea,vomiting, fever, NSAID use. His mother had her gall bladder taken out. No native [place] ancestry. No complains presently.No diarrhea .   MRCP showed no biliary obstruction. HIDA scan showed no blockage in the cystic duct. RUQ USG multiple gall stones upto 11 mm. Elevated transaminases and direct bilirubin. INR 1.01. Lipase x 2 normal.    Hepatic Function Latest Ref Rng & Units 12/31/2016 12/30/2016 12/29/2016  Total Protein 6.5 - 8.1 g/dL 6.6 7.4 7.5  Albumin 3.5 - 5.0 g/dL 3.7 4.3 4.4  AST 15 - 41 U/L 180(H) 213(H) 197(H)  ALT 17 - 63 U/L 239(H) 253(H) 170(H)  Alk Phosphatase 38 - 126 U/L 107 108 93  Total Bilirubin 0.3 - 1.2 mg/dL 6.8(H) 8.7(H) 6.9(H)  Bilirubin, Direct 0.1 - 0.5 mg/dL 3.3(H) - -     History reviewed. No pertinent past medical history.  History reviewed. No pertinent surgical history.  Prior to Admission medications   Not on File    History reviewed. No pertinent family history.   Social History  Substance Use Topics  . Smoking status: Current Every Day Smoker  . Smokeless tobacco: Never Used  . Alcohol use Yes    Allergies as  of 12/30/2016  . (No Known Allergies)    Review of Systems:    All systems reviewed and negative except where noted in HPI.   Physical Exam:  Vital signs in last 24 hours: Temp:  [98.2 F (36.8 C)-98.8 F (37.1 C)] 98.2 F (36.8 C) (08/03 0419) Pulse Rate:  [51-70] 67 (08/03 0419) Resp:  [10-20] 20 (08/03 0419) BP: (134-155)/(72-101) 135/72 (08/03 0419) SpO2:  [95 %-100 %] 96 % (08/03 0419) Weight:  [282 lb 12.8 oz (128.3 kg)-285 lb (129.3 kg)] 282 lb 12.8 oz (128.3 kg) (08/03 0500) Last BM Date: 12/30/16 General:   Pleasant, cooperative in NAD Head:  Normocephalic and atraumatic. Eyes:   No icterus.   Conjunctiva pink. PERRLA. Ears:  Normal auditory acuity. Neck:  Supple; no masses or thyroidomegaly Lungs: Respirations even and unlabored. Lungs clear to auscultation bilaterally.   No wheezes, crackles, or rhonchi.  Heart:  Regular rate and rhythm;  Without murmur, clicks, rubs or gallops Abdomen:  Soft, nondistended, nontender. Normal bowel sounds. No appreciable masses or hepatomegaly.  No rebound or guarding.  Rectal:  Not performed. Neurologic:  Alert and oriented x3;  grossly normal neurologically. Skin:  Intact without significant lesions or rashes. Cervical Nodes:  No significant cervical adenopathy. Psych:  Alert and cooperative. Normal affect.  LAB RESULTS:  Recent Labs  12/29/16 1152 12/30/16 1605 12/31/16 0342  WBC 10.0 7.6 7.4  HGB 17.8 16.3 15.8  HCT 50.7 47.3 45.0  PLT 178 175 149*   BMET  Recent Labs  12/29/16 1152 12/30/16 1605 12/31/16 0342  NA 139 137 140  K 4.8 3.8 4.0  CL 104 105 108  CO2 _0 GLUCOSE 124* 117* 99  BUN _1 CREATININE 0.98 0.86 0.87  CALCIUM 10.0 9.3 8.9   LFT  Recent Labs  12/31/16 0342  PROT 6.6  ALBUMIN 3.7  AST 180*  ALT 239*  ALKPHOS 107  BILITOT 6.8*  BILIDIR 3.3*  IBILI 3.5*   PT/INR  Recent Labs  12/30/16 2254 12/31/16 0342  LABPROT 13.4 13.3  INR 1.02 1.01    STUDIES: Dg Chest  2 View  Result Date: 12/29/2016 CLINICAL DATA:  Epigastric pain in shortness of breath. EXAM: CHEST  2 VIEW COMPARISON:  CT abdomen and pelvis 03/26/2013 FINDINGS: The cardiomediastinal silhouette is within normal limits. The lungs are slightly hypoinflated. No airspace consolidation, edema, pleural effusion, or pneumothorax is identified. Thoracic spondylosis is noted. Two subcentimeter round calcifications in the right upper abdomen corresponds to previously demonstrated gallstones. IMPRESSION: No active cardiopulmonary disease. Electronically Signed   By: Logan Bores M.D.   On: 12/29/2016 13:01   Nm Hepatobiliary Liver Func  Result Date: 12/31/2016 CLINICAL DATA:  Cholelithiasis with right upper quadrant pain. EXAM: NUCLEAR MEDICINE HEPATOBILIARY IMAGING TECHNIQUE: Sequential images of the abdomen were obtained out to 60 minutes following intravenous administration of radiopharmaceutical. RADIOPHARMACEUTICALS:  5.1 mCi Tc-72m Choletec IV COMPARISON:  MRI from 12/30/2016. FINDINGS: Prompt uptake and biliary excretion of activity by the liver is seen. Gallbladder activity is visualized by 20-25 minutes, consistent with patency of cystic duct. Biliary activity passes into small bowel, consistent with patent common bile duct. IMPRESSION: Normal hepatobiliary patency scan. No evidence for cystic duct obstruction. Electronically Signed   By: EMisty StanleyM.D.   On: 12/31/2016 09:12   Mr 3d Recon At Scanner  Result Date: 12/30/2016 CLINICAL DATA:  Epigastric/ upper abdominal pain, cholelithiasis EXAM: MRI ABDOMEN WITHOUT AND WITH CONTRAST (INCLUDING MRCP) TECHNIQUE: Multiplanar multisequence MR imaging of the abdomen was performed both before and after the administration of intravenous contrast. Heavily T2-weighted images of the biliary and pancreatic ducts were obtained, and three-dimensional MRCP images were rendered by post processing. CONTRAST:  280mMULTIHANCE GADOBENATE DIMEGLUMINE 529 MG/ML IV SOLN  COMPARISON:  Right upper quadrant ultrasound dated 12/30/2016 FINDINGS: Lower chest: Lung bases are clear. Hepatobiliary: Moderate hepatic steatosis. No suspicious/enhancing hepatic lesions. Layering small gallstones measuring up to 10 mm (series 4/ image 15). No associated inflammatory changes. No intrahepatic or extrahepatic ductal dilatation. Common duct measures 5 mm. No choledocholithiasis is seen. Pancreas:  Within normal limits. Spleen:  Within normal limits. Adrenals/Urinary Tract:  Adrenal glands are within normal limits. Kidneys are within normal limits.  No hydronephrosis. Stomach/Bowel: Stomach is within normal limits. Visualized bowel is unremarkable. Vascular/Lymphatic:  No evidence of abdominal aortic aneurysm. No suspicious abdominal lymphadenopathy. Other:  No abdominal ascites. Musculoskeletal: No focal osseous lesions IMPRESSION: Cholelithiasis, without associated inflammatory changes. No intrahepatic or extrahepatic ductal dilatation. Common duct measures 5 mm. No choledocholithiasis is seen. Moderate hepatic steatosis. Electronically Signed   By: SrJulian Hy.D.   On: 12/30/2016 21:14   Mr Abdomen Mrcp W Moise Boringontast  Result Date: 12/30/2016 CLINICAL DATA:  Epigastric/ upper abdominal pain, cholelithiasis EXAM: MRI ABDOMEN WITHOUT AND WITH CONTRAST (INCLUDING MRCP)  TECHNIQUE: Multiplanar multisequence MR imaging of the abdomen was performed both before and after the administration of intravenous contrast. Heavily T2-weighted images of the biliary and pancreatic ducts were obtained, and three-dimensional MRCP images were rendered by post processing. CONTRAST:  37m MULTIHANCE GADOBENATE DIMEGLUMINE 529 MG/ML IV SOLN COMPARISON:  Right upper quadrant ultrasound dated 12/30/2016 FINDINGS: Lower chest: Lung bases are clear. Hepatobiliary: Moderate hepatic steatosis. No suspicious/enhancing hepatic lesions. Layering small gallstones measuring up to 10 mm (series 4/ image 15). No associated  inflammatory changes. No intrahepatic or extrahepatic ductal dilatation. Common duct measures 5 mm. No choledocholithiasis is seen. Pancreas:  Within normal limits. Spleen:  Within normal limits. Adrenals/Urinary Tract:  Adrenal glands are within normal limits. Kidneys are within normal limits.  No hydronephrosis. Stomach/Bowel: Stomach is within normal limits. Visualized bowel is unremarkable. Vascular/Lymphatic:  No evidence of abdominal aortic aneurysm. No suspicious abdominal lymphadenopathy. Other:  No abdominal ascites. Musculoskeletal: No focal osseous lesions IMPRESSION: Cholelithiasis, without associated inflammatory changes. No intrahepatic or extrahepatic ductal dilatation. Common duct measures 5 mm. No choledocholithiasis is seen. Moderate hepatic steatosis. Electronically Signed   By: SJulian HyM.D.   On: 12/30/2016 21:14   UKoreaAbdomen Limited Ruq  Result Date: 12/30/2016 CLINICAL DATA:  Right upper quadrant pain x3 days EXAM: ULTRASOUND ABDOMEN LIMITED RIGHT UPPER QUADRANT COMPARISON:  12/29/2016 FINDINGS: Gallbladder: Multiple gallstones measuring up to 11 mm. No gallbladder wall thickening or pericholecystic fluid. Negative sonographic Murphy's sign. Common bile duct: Diameter: 5 mm Liver: Hyperechoic hepatic parenchyma.  No focal hepatic lesion is seen. IMPRESSION: Cholelithiasis, without associated inflammatory changes to suggest acute cholecystitis. Suspected hepatic steatosis. Electronically Signed   By: SJulian HyM.D.   On: 12/30/2016 17:29   [place]Abdomen Limited Ruq  Result Date: 12/29/2016 CLINICAL DATA:  Elevated liver enzymes and upper abdominal pain EXAM: ULTRASOUND ABDOMEN LIMITED RIGHT UPPER QUADRANT COMPARISON:  None. FINDINGS: Gallbladder: There is a 9 mm calculus adherent in the neck of the gallbladder. There is no gallbladder wall thickening or pericholecystic fluid. No sonographic Murphy sign noted by sonographer. Common bile duct: Diameter: 6 mm. No intrahepatic  or extrahepatic biliary duct dilatation. Liver: No focal lesion identified. Liver echogenicity increased diffusely. Flow in the portal vein appears in the anatomic direction. IMPRESSION: Gallstone adherent in neck of gallbladder. No gallbladder wall thickening or pericholecystic fluid. Increased liver echogenicity, a finding most likely indicative of hepatic steatosis. While no focal liver lesions are evident on this study, it must be cautioned that the sensitivity of ultrasound for detection of focal liver lesions is diminished in this circumstance. Electronically Signed   By: WLowella GripIII M.D.   On: 12/29/2016 15:19      Impression / Plan:   Patrick REDNERis a 51y.o. y/o male with admitted with intermittent RUQ pain over two days, elevated LFT's , gall stones in gall bladder noted. No stones in CBD on MRCP.  LFT's are improving which could suggest he passed a gall stone. His pain was after a meal and resolved rapidly which also could indicate passage of a stone.    Plan  1. Viral hepatitis screen , autoimmune screen to r/o hepatitis  2. Treand LFT's and INR tomorrow if rapidly improving could also indicate passage of a stone 3. Surgery consult recommendations   Thank you for involving me in the care of this patient.      LOS: 1 day   KJonathon Bellows MD  12/31/2016, 10:27 AM

## 2016-12-31 NOTE — Consult Note (Signed)
Date of Consultation:  12/31/2016  Requesting Physician:  Max Sane, MD  Reason for Consultation:  Elevated LFTs  History of Present Illness: Patrick Peterson is a 51 y.o. male who presents with elevated LFTs and abdominal pain.  He was seen in the ED on 8/1 and had a total bilirubin of 6.9, and ultrasound showing cholelithiasis, with normal CBD diameter. At that point, he reports that he started having epigastric pain that morning after eating a cheese/bacon breakfast sandwich.  His pain continued and he went to the ED.  His pain resolved in the ED after getting a GI cocktail and was discharged to home.  He then presented again yesterday with another episode after eating a grilled chicken sandwich, and with a total bilirubin of 8.7, with repeat ultrasound showing normal CBD and then with MRCP showing no choledocholithiasis.  He was admitted to medicine for workup.  He had a HIDA scan today which was negative for any obstruction of CBD or cystic duct.  He is currently asymptomatic and hungry.  Overall, he denies having any emesis and only one episode of mild nausea the first time he went to the ED.  However, he does report that he's been having dark color urine over the past two weeks.  He also reports light colored stools yesterday.  Denies any other prior episodes of pain in the past.  Denies any fevers, chills, chest pain, shortness of breath.  Past Medical History: --Kidney stones  Past Surgical History: --Laparoscopic appendectomy --Right knee arthroscopy  Home Medications: Prior to Admission medications   None    Allergies: No Known Allergies  Social History:  reports that he has been smoking.  He has never used smokeless tobacco. He reports that he drinks alcohol. He reports that he does not use drugs.   Family History: --Diabetes (mother)  Review of Systems: Review of Systems  Constitutional: Negative for chills and fever.  HENT: Negative for hearing loss.   Respiratory:  Negative for cough and shortness of breath.   Cardiovascular: Negative for chest pain.  Gastrointestinal: Positive for abdominal pain.  Genitourinary: Negative for dysuria.  Musculoskeletal: Negative for myalgias.  Skin: Negative for rash.  Neurological: Negative for dizziness.  Psychiatric/Behavioral: Negative for depression.  All other systems reviewed and are negative.   Physical Exam BP 135/72 (BP Location: Left Arm)   Pulse 67   Temp 98.2 F (36.8 C) (Oral)   Resp 20   Ht 6\' 2"  (1.88 m)   Wt 128.3 kg (282 lb 12.8 oz)   SpO2 96%   BMI 36.31 kg/m  CONSTITUTIONAL: No acute distress HEENT:  Normocephalic, atraumatic, extraocular motion intact. NECK: Trachea is midline, and there is no jugular venous distension.  RESPIRATORY:  Lungs are clear, and breath sounds are equal bilaterally. Normal respiratory effort without pathologic use of accessory muscles. CARDIOVASCULAR: Heart is regular without murmurs, gallops, or rubs. GI: The abdomen is soft, nondistended, nontender to palpation. There were no palpable masses.  MUSCULOSKELETAL:  Normal muscle strength and tone in all four extremities.  No peripheral edema or cyanosis. SKIN: Skin turgor is normal. There are no pathologic skin lesions.  NEUROLOGIC:  Motor and sensation is grossly normal.  Cranial nerves are grossly intact. PSYCH:  Alert and oriented to person, place and time. Affect is normal.  Laboratory Analysis: Results for orders placed or performed during the hospital encounter of 12/30/16 (from the past 24 hour(s))  Lipase, blood     Status: None   Collection Time:  12/30/16  4:05 PM  Result Value Ref Range   Lipase 49 11 - 51 U/L  Comprehensive metabolic panel     Status: Abnormal   Collection Time: 12/30/16  4:05 PM  Result Value Ref Range   Sodium 137 135 - 145 mmol/L   Potassium 3.8 3.5 - 5.1 mmol/L   Chloride 105 101 - 111 mmol/L   CO2 24 22 - 32 mmol/L   Glucose, Bld 117 (H) 65 - 99 mg/dL   BUN 14 6 - 20  mg/dL   Creatinine, Ser 0.86 0.61 - 1.24 mg/dL   Calcium 9.3 8.9 - 10.3 mg/dL   Total Protein 7.4 6.5 - 8.1 g/dL   Albumin 4.3 3.5 - 5.0 g/dL   AST 213 (H) 15 - 41 U/L   ALT 253 (H) 17 - 63 U/L   Alkaline Phosphatase 108 38 - 126 U/L   Total Bilirubin 8.7 (H) 0.3 - 1.2 mg/dL   GFR calc non Af Amer >60 >60 mL/min   GFR calc Af Amer >60 >60 mL/min   Anion gap 8 5 - 15  CBC     Status: None   Collection Time: 12/30/16  4:05 PM  Result Value Ref Range   WBC 7.6 3.8 - 10.6 K/uL   RBC 5.24 4.40 - 5.90 MIL/uL   Hemoglobin 16.3 13.0 - 18.0 g/dL   HCT 47.3 40.0 - 52.0 %   MCV 90.3 80.0 - 100.0 fL   MCH 31.1 26.0 - 34.0 pg   MCHC 34.5 32.0 - 36.0 g/dL   RDW 13.6 11.5 - 14.5 %   Platelets 175 150 - 440 K/uL  Troponin I     Status: None   Collection Time: 12/30/16  4:05 PM  Result Value Ref Range   Troponin I <0.03 <0.03 ng/mL  Urinalysis, Complete w Microscopic     Status: Abnormal   Collection Time: 12/30/16  5:55 PM  Result Value Ref Range   Color, Urine AMBER (A) YELLOW   APPearance CLEAR (A) CLEAR   Specific Gravity, Urine 1.012 1.005 - 1.030   pH 7.0 5.0 - 8.0   Glucose, UA NEGATIVE NEGATIVE mg/dL   Hgb urine dipstick NEGATIVE NEGATIVE   Bilirubin Urine NEGATIVE NEGATIVE   Ketones, ur NEGATIVE NEGATIVE mg/dL   Protein, ur NEGATIVE NEGATIVE mg/dL   Nitrite NEGATIVE NEGATIVE   Leukocytes, UA NEGATIVE NEGATIVE   RBC / HPF 0-5 0 - 5 RBC/hpf   WBC, UA 0-5 0 - 5 WBC/hpf   Bacteria, UA RARE (A) NONE SEEN   Squamous Epithelial / LPF NONE SEEN NONE SEEN   Mucous PRESENT   Acetaminophen level     Status: Abnormal   Collection Time: 12/30/16  9:47 PM  Result Value Ref Range   Acetaminophen (Tylenol), Serum <10 (L) 10 - 30 ug/mL  Protime-INR     Status: None   Collection Time: 12/30/16 10:54 PM  Result Value Ref Range   Prothrombin Time 13.4 11.4 - 15.2 seconds   INR 1.02   CBC     Status: Abnormal   Collection Time: 12/31/16  3:42 AM  Result Value Ref Range   WBC 7.4 3.8 -  10.6 K/uL   RBC 4.94 4.40 - 5.90 MIL/uL   Hemoglobin 15.8 13.0 - 18.0 g/dL   HCT 45.0 40.0 - 52.0 %   MCV 91.1 80.0 - 100.0 fL   MCH 31.9 26.0 - 34.0 pg   MCHC 35.1 32.0 - 36.0 g/dL   RDW 13.8  11.5 - 14.5 %   Platelets 149 (L) 150 - 440 K/uL  Protime-INR     Status: None   Collection Time: 12/31/16  3:42 AM  Result Value Ref Range   Prothrombin Time 13.3 11.4 - 15.2 seconds   INR 1.01   APTT     Status: None   Collection Time: 12/31/16  3:42 AM  Result Value Ref Range   aPTT 26 24 - 36 seconds  Hepatic function panel     Status: Abnormal   Collection Time: 12/31/16  3:42 AM  Result Value Ref Range   Total Protein 6.6 6.5 - 8.1 g/dL   Albumin 3.7 3.5 - 5.0 g/dL   AST 180 (H) 15 - 41 U/L   ALT 239 (H) 17 - 63 U/L   Alkaline Phosphatase 107 38 - 126 U/L   Total Bilirubin 6.8 (H) 0.3 - 1.2 mg/dL   Bilirubin, Direct 3.3 (H) 0.1 - 0.5 mg/dL   Indirect Bilirubin 3.5 (H) 0.3 - 0.9 mg/dL  Basic metabolic panel     Status: None   Collection Time: 12/31/16  3:42 AM  Result Value Ref Range   Sodium 140 135 - 145 mmol/L   Potassium 4.0 3.5 - 5.1 mmol/L   Chloride 108 101 - 111 mmol/L   CO2 25 22 - 32 mmol/L   Glucose, Bld 99 65 - 99 mg/dL   BUN 12 6 - 20 mg/dL   Creatinine, Ser 0.87 0.61 - 1.24 mg/dL   Calcium 8.9 8.9 - 10.3 mg/dL   GFR calc non Af Amer >60 >60 mL/min   GFR calc Af Amer >60 >60 mL/min   Anion gap 7 5 - 15  Glucose, capillary     Status: Abnormal   Collection Time: 12/31/16  7:46 AM  Result Value Ref Range   Glucose-Capillary 106 (H) 65 - 99 mg/dL    Imaging: Nm Hepatobiliary Liver Func  Result Date: 12/31/2016 CLINICAL DATA:  Cholelithiasis with right upper quadrant pain. EXAM: NUCLEAR MEDICINE HEPATOBILIARY IMAGING TECHNIQUE: Sequential images of the abdomen were obtained out to 60 minutes following intravenous administration of radiopharmaceutical. RADIOPHARMACEUTICALS:  5.1 mCi Tc-23m  Choletec IV COMPARISON:  MRI from 12/30/2016. FINDINGS: Prompt uptake and  biliary excretion of activity by the liver is seen. Gallbladder activity is visualized by 20-25 minutes, consistent with patency of cystic duct. Biliary activity passes into small bowel, consistent with patent common bile duct. IMPRESSION: Normal hepatobiliary patency scan. No evidence for cystic duct obstruction. Electronically Signed   By: Misty Stanley M.D.   On: 12/31/2016 09:12   Mr 3d Recon At Scanner  Result Date: 12/30/2016 CLINICAL DATA:  Epigastric/ upper abdominal pain, cholelithiasis EXAM: MRI ABDOMEN WITHOUT AND WITH CONTRAST (INCLUDING MRCP) TECHNIQUE: Multiplanar multisequence MR imaging of the abdomen was performed both before and after the administration of intravenous contrast. Heavily T2-weighted images of the biliary and pancreatic ducts were obtained, and three-dimensional MRCP images were rendered by post processing. CONTRAST:  61mL MULTIHANCE GADOBENATE DIMEGLUMINE 529 MG/ML IV SOLN COMPARISON:  Right upper quadrant ultrasound dated 12/30/2016 FINDINGS: Lower chest: Lung bases are clear. Hepatobiliary: Moderate hepatic steatosis. No suspicious/enhancing hepatic lesions. Layering small gallstones measuring up to 10 mm (series 4/ image 15). No associated inflammatory changes. No intrahepatic or extrahepatic ductal dilatation. Common duct measures 5 mm. No choledocholithiasis is seen. Pancreas:  Within normal limits. Spleen:  Within normal limits. Adrenals/Urinary Tract:  Adrenal glands are within normal limits. Kidneys are within normal limits.  No hydronephrosis. Stomach/Bowel:  Stomach is within normal limits. Visualized bowel is unremarkable. Vascular/Lymphatic:  No evidence of abdominal aortic aneurysm. No suspicious abdominal lymphadenopathy. Other:  No abdominal ascites. Musculoskeletal: No focal osseous lesions IMPRESSION: Cholelithiasis, without associated inflammatory changes. No intrahepatic or extrahepatic ductal dilatation. Common duct measures 5 mm. No choledocholithiasis is seen.  Moderate hepatic steatosis. Electronically Signed   By: Julian Hy M.D.   On: 12/30/2016 21:14   Mr Abdomen Mrcp Moise Boring Contast  Result Date: 12/30/2016 CLINICAL DATA:  Epigastric/ upper abdominal pain, cholelithiasis EXAM: MRI ABDOMEN WITHOUT AND WITH CONTRAST (INCLUDING MRCP) TECHNIQUE: Multiplanar multisequence MR imaging of the abdomen was performed both before and after the administration of intravenous contrast. Heavily T2-weighted images of the biliary and pancreatic ducts were obtained, and three-dimensional MRCP images were rendered by post processing. CONTRAST:  29mL MULTIHANCE GADOBENATE DIMEGLUMINE 529 MG/ML IV SOLN COMPARISON:  Right upper quadrant ultrasound dated 12/30/2016 FINDINGS: Lower chest: Lung bases are clear. Hepatobiliary: Moderate hepatic steatosis. No suspicious/enhancing hepatic lesions. Layering small gallstones measuring up to 10 mm (series 4/ image 15). No associated inflammatory changes. No intrahepatic or extrahepatic ductal dilatation. Common duct measures 5 mm. No choledocholithiasis is seen. Pancreas:  Within normal limits. Spleen:  Within normal limits. Adrenals/Urinary Tract:  Adrenal glands are within normal limits. Kidneys are within normal limits.  No hydronephrosis. Stomach/Bowel: Stomach is within normal limits. Visualized bowel is unremarkable. Vascular/Lymphatic:  No evidence of abdominal aortic aneurysm. No suspicious abdominal lymphadenopathy. Other:  No abdominal ascites. Musculoskeletal: No focal osseous lesions IMPRESSION: Cholelithiasis, without associated inflammatory changes. No intrahepatic or extrahepatic ductal dilatation. Common duct measures 5 mm. No choledocholithiasis is seen. Moderate hepatic steatosis. Electronically Signed   By: Julian Hy M.D.   On: 12/30/2016 21:14   US Abdomen Limited Ruq  Result Date: 12/30/2016 CLINICAL DATA:  Right upper quadrant pain x3 days EXAM: ULTRASOUND ABDOMEN LIMITED RIGHT UPPER QUADRANT COMPARISON:   12/29/2016 FINDINGS: Gallbladder: Multiple gallstones measuring up to 11 mm. No gallbladder wall thickening or pericholecystic fluid. Negative sonographic Murphy's sign. Common bile duct: Diameter: 5 mm Liver: Hyperechoic hepatic parenchyma.  No focal hepatic lesion is seen. IMPRESSION: Cholelithiasis, without associated inflammatory changes to suggest acute cholecystitis. Suspected hepatic steatosis. Electronically Signed   By: Julian Hy M.D.   On: 12/30/2016 17:29    Assessment and Plan: This is a 51 y.o. male who presents with elevated LFTs and intermittent abdominal pain.  I have independently viewed the patient's imaging studies and reviewed his laboratory studies.  Overall, his LFTs have been elevated since at least 8/1 when he came to the ED, and given his history, he may have had an elevation for some time now since he says his urine had been dark for two weeks.  His ultrasound did not show any CBD dilation, and his MRCP did not show any choledocholithiasis.  He does not have any cholecystitis and is currently pain free and hungry.  This hyperbilirubinemia could be liver intrinsic in nature vs due to gallstones, but his studies do not show any blockages in CBD or cystic duct.  Would recommend working him up for possible intrinsic liver disease and agree with GI consultation.  If the workup is negative, then probably he could be passing stones that are causing partial blockages only, as his total bilirubin did decrease from 8.7 to 6.8 this morning.  Would continue trending LFTs to confirm.  Did discuss with the patient that if that is the case, then we could talk about doing a  cholecystectomy with cholangiogram during this admission, but we would have to be sure that other possible etiologies are negative prior to any surgical intervention.  Currently I do not see a need for antibiotics, and as the patient is pain free, do not have any specific reason to keep him NPO.  Will continue following  along with you.   [name], [place]

## 2016-12-31 NOTE — Progress Notes (Signed)
[facility] NAME: Patrick Peterson    MR#:  [id number]  DATE OF BIRTH:  02/28/1966  SUBJECTIVE:   Came in with recurrent abd pain after eating with vomiting Non since admission. Tolerating po diet REVIEW OF SYSTEMS:   Review of Systems  Constitutional: Negative for chills, fever and weight loss.  HENT: Negative for ear discharge, ear pain and nosebleeds.   Eyes: Negative for blurred vision, pain and discharge.  Respiratory: Negative for sputum production, shortness of breath, wheezing and stridor.   Cardiovascular: Negative for chest pain, palpitations, orthopnea and PND.  Gastrointestinal: Negative for abdominal pain, diarrhea, nausea and vomiting.  Genitourinary: Negative for frequency and urgency.  Musculoskeletal: Negative for back pain and joint pain.  Neurological: Negative for sensory change, speech change, focal weakness and weakness.  Psychiatric/Behavioral: Negative for depression and hallucinations. The patient is not nervous/anxious.    Tolerating Diet:yes Tolerating PT: not needed  DRUG ALLERGIES:  No Known Allergies  VITALS:  Blood pressure (!) 147/89, pulse (!) 52, temperature 98.2 F (36.8 C), temperature source Oral, resp. rate 12, height 6\' 2"  (1.88 m), weight 128.3 kg (282 lb 12.8 oz), SpO2 96 %.  PHYSICAL EXAMINATION:   Physical Exam  GENERAL:  51 y.o.-year-old patient lying in the bed with no acute distress.  EYES: Pupils equal, round, reactive to light and accommodation. No scleral icterus. Extraocular muscles intact.  HEENT: Head atraumatic, normocephalic. Oropharynx and nasopharynx clear.  NECK:  Supple, no jugular venous distention. No thyroid enlargement, no tenderness.  LUNGS: Normal breath sounds bilaterally, no wheezing, rales, rhonchi. No use of accessory muscles of respiration.  CARDIOVASCULAR: S1, S2 normal. No murmurs, rubs, or gallops.  ABDOMEN: Soft, nontender, nondistended. Bowel  sounds present. No organomegaly or mass.  EXTREMITIES: No cyanosis, clubbing or edema b/l.    NEUROLOGIC: Cranial nerves II through XII are intact. No focal Motor or sensory deficits b/l.   PSYCHIATRIC:  patient is alert and oriented x 3.  SKIN: No obvious rash, lesion, or ulcer.   LABORATORY PANEL:  CBC  Recent Labs Lab 12/31/16 0342  WBC 7.4  HGB 15.8  HCT 45.0  PLT 149*    Chemistries   Recent Labs Lab 12/31/16 0342  NA 140  K 4.0  CL 108  CO2 25  GLUCOSE 99  BUN 12  CREATININE 0.87  CALCIUM 8.9  AST 180*  ALT 239*  ALKPHOS 107  BILITOT 6.8*   Cardiac Enzymes  Recent Labs Lab 12/30/16 1605  TROPONINI <0.03   RADIOLOGY:  Nm Hepatobiliary Liver Func  Result Date: 12/31/2016 CLINICAL DATA:  Cholelithiasis with right upper quadrant pain. EXAM: NUCLEAR MEDICINE HEPATOBILIARY IMAGING TECHNIQUE: Sequential images of the abdomen were obtained out to 60 minutes following intravenous administration of radiopharmaceutical. RADIOPHARMACEUTICALS:  5.1 mCi Tc-37m  Choletec IV COMPARISON:  MRI from 12/30/2016. FINDINGS: Prompt uptake and biliary excretion of activity by the liver is seen. Gallbladder activity is visualized by 20-25 minutes, consistent with patency of cystic duct. Biliary activity passes into small bowel, consistent with patent common bile duct. IMPRESSION: Normal hepatobiliary patency scan. No evidence for cystic duct obstruction. Electronically Signed   By: Misty Stanley M.D.   On: 12/31/2016 09:12   Mr 3d Recon At Scanner  Result Date: 12/30/2016 CLINICAL DATA:  Epigastric/ upper abdominal pain, cholelithiasis EXAM: MRI ABDOMEN WITHOUT AND WITH CONTRAST (INCLUDING MRCP) TECHNIQUE: Multiplanar multisequence MR imaging of the abdomen was performed both before and after the administration  of intravenous contrast. Heavily T2-weighted images of the biliary and pancreatic ducts were obtained, and three-dimensional MRCP images were rendered by post processing.  CONTRAST:  45mL MULTIHANCE GADOBENATE DIMEGLUMINE 529 MG/ML IV SOLN COMPARISON:  Right upper quadrant ultrasound dated 12/30/2016 FINDINGS: Lower chest: Lung bases are clear. Hepatobiliary: Moderate hepatic steatosis. No suspicious/enhancing hepatic lesions. Layering small gallstones measuring up to 10 mm (series 4/ image 15). No associated inflammatory changes. No intrahepatic or extrahepatic ductal dilatation. Common duct measures 5 mm. No choledocholithiasis is seen. Pancreas:  Within normal limits. Spleen:  Within normal limits. Adrenals/Urinary Tract:  Adrenal glands are within normal limits. Kidneys are within normal limits.  No hydronephrosis. Stomach/Bowel: Stomach is within normal limits. Visualized bowel is unremarkable. Vascular/Lymphatic:  No evidence of abdominal aortic aneurysm. No suspicious abdominal lymphadenopathy. Other:  No abdominal ascites. Musculoskeletal: No focal osseous lesions IMPRESSION: Cholelithiasis, without associated inflammatory changes. No intrahepatic or extrahepatic ductal dilatation. Common duct measures 5 mm. No choledocholithiasis is seen. Moderate hepatic steatosis. Electronically Signed   By: Julian Hy M.D.   On: 12/30/2016 21:14   Mr Abdomen Mrcp Moise Boring Contast  Result Date: 12/30/2016 CLINICAL DATA:  Epigastric/ upper abdominal pain, cholelithiasis EXAM: MRI ABDOMEN WITHOUT AND WITH CONTRAST (INCLUDING MRCP) TECHNIQUE: Multiplanar multisequence MR imaging of the abdomen was performed both before and after the administration of intravenous contrast. Heavily T2-weighted images of the biliary and pancreatic ducts were obtained, and three-dimensional MRCP images were rendered by post processing. CONTRAST:  35mL MULTIHANCE GADOBENATE DIMEGLUMINE 529 MG/ML IV SOLN COMPARISON:  Right upper quadrant ultrasound dated 12/30/2016 FINDINGS: Lower chest: Lung bases are clear. Hepatobiliary: Moderate hepatic steatosis. No suspicious/enhancing hepatic lesions. Layering small  gallstones measuring up to 10 mm (series 4/ image 15). No associated inflammatory changes. No intrahepatic or extrahepatic ductal dilatation. Common duct measures 5 mm. No choledocholithiasis is seen. Pancreas:  Within normal limits. Spleen:  Within normal limits. Adrenals/Urinary Tract:  Adrenal glands are within normal limits. Kidneys are within normal limits.  No hydronephrosis. Stomach/Bowel: Stomach is within normal limits. Visualized bowel is unremarkable. Vascular/Lymphatic:  No evidence of abdominal aortic aneurysm. No suspicious abdominal lymphadenopathy. Other:  No abdominal ascites. Musculoskeletal: No focal osseous lesions IMPRESSION: Cholelithiasis, without associated inflammatory changes. No intrahepatic or extrahepatic ductal dilatation. Common duct measures 5 mm. No choledocholithiasis is seen. Moderate hepatic steatosis. Electronically Signed   By: Julian Hy M.D.   On: 12/30/2016 21:14   US Abdomen Limited Ruq  Result Date: 12/30/2016 CLINICAL DATA:  Right upper quadrant pain x3 days EXAM: ULTRASOUND ABDOMEN LIMITED RIGHT UPPER QUADRANT COMPARISON:  12/29/2016 FINDINGS: Gallbladder: Multiple gallstones measuring up to 11 mm. No gallbladder wall thickening or pericholecystic fluid. Negative sonographic Murphy's sign. Common bile duct: Diameter: 5 mm Liver: Hyperechoic hepatic parenchyma.  No focal hepatic lesion is seen. IMPRESSION: Cholelithiasis, without associated inflammatory changes to suggest acute cholecystitis. Suspected hepatic steatosis. Electronically Signed   By: Julian Hy M.D.   On: 12/30/2016 17:29   ASSESSMENT AND PLAN:  51 year old white male being admitted for abdominal pain and elevated total bilirubin.    * Acute transaminitis - primary liver disease versus gallbladder disease - Appreciate GI and surgical consultation - Case discussed with Dr. Vicente Males  - HIDA scan, MRCP abd negative for CBD obstruction. Pending GI labs -[place] abd shows layering of  Gallstones--- ?bilirary colic -May end up getting CCK  * Elevated total bilirubin - Workup as above  * Abdominal pain and nausea - Unknown etiology.  Could be related  to gallbladder/gallstones.  As his pain is only when he eats  * DJD of the knees - No acute pain, outpatient evaluation   Case discussed with Care Management/Social Worker. Management plans discussed with the patient, family and they are in agreement.  CODE STATUS: full DVT Prophylaxis: lovenox  TOTAL TIME TAKING CARE OF THIS PATIENT: 30 minutes.  >50% time spent on counselling and coordination of care  POSSIBLE D/C IN *1-2* DAYS, DEPENDING ON CLINICAL CONDITION.  Note: This dictation was prepared with Dragon dictation along with smaller phrase technology. Any transcriptional errors that result from this process are unintentional.  Kylle Lall M.D on 12/31/2016 at 5:01 PM  Between 7am to 6pm - Pager - [phone]  After 6pm go to [email or web address] - password [email or web address] [facility]  Office  [phone]  CC: Primary care physician; Katheren Shams

## 2017-01-01 LAB — HEPATIC FUNCTION PANEL
ALBUMIN: 3.9 g/dL (ref 3.5–5.0)
ALK PHOS: 118 U/L (ref 38–126)
ALT: 235 U/L — AB (ref 17–63)
AST: 152 U/L — AB (ref 15–41)
Bilirubin, Direct: 2.7 mg/dL — ABNORMAL HIGH (ref 0.1–0.5)
Indirect Bilirubin: 3 mg/dL — ABNORMAL HIGH (ref 0.3–0.9)
TOTAL PROTEIN: 7.1 g/dL (ref 6.5–8.1)
Total Bilirubin: 5.7 mg/dL — ABNORMAL HIGH (ref 0.3–1.2)

## 2017-01-01 LAB — HEPATITIS PANEL, ACUTE
HEP B C IGM: NEGATIVE
HEP B S AG: NEGATIVE
Hep A IgM: NEGATIVE

## 2017-01-01 LAB — ANA W/REFLEX IF POSITIVE: ANA: NEGATIVE

## 2017-01-01 LAB — HEPATITIS B DNA, ULTRAQUANTITATIVE, PCR
HBV DNA SERPL PCR-ACNC: NOT DETECTED [IU]/mL
HBV DNA SERPL PCR-LOG IU: UNDETERMINED log10 IU/mL

## 2017-01-01 LAB — GLUCOSE, CAPILLARY
Glucose-Capillary: 99 mg/dL (ref 65–99)
Glucose-Capillary: 99 mg/dL (ref 65–99)

## 2017-01-01 LAB — SURGICAL PCR SCREEN
MRSA, PCR: NEGATIVE
STAPHYLOCOCCUS AUREUS: POSITIVE — AB

## 2017-01-01 LAB — CERULOPLASMIN: CERULOPLASMIN: 22.5 mg/dL (ref 16.0–31.0)

## 2017-01-01 LAB — EBV AB TO VIRAL CAPSID AG PNL, IGG+IGM: EBV VCA IgM: <36 U/mL (ref 0.0–35.9)

## 2017-01-01 LAB — HIV ANTIBODY (ROUTINE TESTING W REFLEX): HIV Screen 4th Generation wRfx: NONREACTIVE

## 2017-01-01 MED ORDER — DEXTROSE 5 % IV SOLN
2.0000 g | INTRAVENOUS | Status: DC
  Filled 2017-01-01: qty 2

## 2017-01-01 MED ORDER — CEFOXITIN SODIUM-DEXTROSE 2-2.2 GM-% IV SOLR (PREMIX)
2.0000 g | INTRAVENOUS | Status: DC
  Filled 2017-01-01 (×2): qty 50

## 2017-01-01 MED ORDER — LACTATED RINGERS IV SOLN
INTRAVENOUS | Status: DC
  Administered 2017-01-02 (×4): via INTRAVENOUS

## 2017-01-01 MED ORDER — CHLORHEXIDINE GLUCONATE CLOTH 2 % EX PADS
6.0000 | MEDICATED_PAD | Freq: Every day | CUTANEOUS | Status: DC
  Administered 2017-01-01: 6 via TOPICAL

## 2017-01-01 MED ORDER — MUPIROCIN 2 % EX OINT
1.0000 "application " | TOPICAL_OINTMENT | Freq: Two times a day (BID) | CUTANEOUS | Status: DC
  Administered 2017-01-01 – 2017-01-02 (×3): 1 via NASAL
  Filled 2017-01-01: qty 22

## 2017-01-01 NOTE — Progress Notes (Signed)
[facility] NAME: Patrick Peterson    MR#:  [id number]  DATE OF BIRTH:  12-Nov-1965  SUBJECTIVE:   Came in with recurrent abd pain after eating with vomiting Non since admission. Tolerating po diet REVIEW OF SYSTEMS:   Review of Systems  Constitutional: Negative for chills, fever and weight loss.  HENT: Negative for ear discharge, ear pain and nosebleeds.   Eyes: Negative for blurred vision, pain and discharge.  Respiratory: Negative for sputum production, shortness of breath, wheezing and stridor.   Cardiovascular: Negative for chest pain, palpitations, orthopnea and PND.  Gastrointestinal: Negative for abdominal pain, diarrhea, nausea and vomiting.  Genitourinary: Negative for frequency and urgency.  Musculoskeletal: Negative for back pain and joint pain.  Neurological: Negative for sensory change, speech change, focal weakness and weakness.  Psychiatric/Behavioral: Negative for depression and hallucinations. The patient is not nervous/anxious.    Tolerating Diet:yes Tolerating PT: not needed  DRUG ALLERGIES:  No Known Allergies  VITALS:  Blood pressure 114/85, pulse 60, temperature (!) 97.5 F (36.4 C), temperature source Oral, resp. rate 16, height 6\' 2"  (1.88 m), weight 128.3 kg (282 lb 12.8 oz), SpO2 99 %.  PHYSICAL EXAMINATION:   Physical Exam  GENERAL:  51 y.o.-year-old patient lying in the bed with no acute distress.  EYES: Pupils equal, round, reactive to light and accommodation. No scleral icterus. Extraocular muscles intact.  HEENT: Head atraumatic, normocephalic. Oropharynx and nasopharynx clear.  NECK:  Supple, no jugular venous distention. No thyroid enlargement, no tenderness.  LUNGS: Normal breath sounds bilaterally, no wheezing, rales, rhonchi. No use of accessory muscles of respiration.  CARDIOVASCULAR: S1, S2 normal. No murmurs, rubs, or gallops.  ABDOMEN: Soft, nontender, nondistended. Bowel  sounds present. No organomegaly or mass.  EXTREMITIES: No cyanosis, clubbing or edema b/l.    NEUROLOGIC: Cranial nerves II through XII are intact. No focal Motor or sensory deficits b/l.   PSYCHIATRIC:  patient is alert and oriented x 3.  SKIN: No obvious rash, lesion, or ulcer.   LABORATORY PANEL:  CBC  Recent Labs Lab 12/31/16 0342  WBC 7.4  HGB 15.8  HCT 45.0  PLT 149*    Chemistries   Recent Labs Lab 12/31/16 0342 01/01/17 0349  NA 140  --   K 4.0  --   CL 108  --   CO2 25  --   GLUCOSE 99  --   BUN 12  --   CREATININE 0.87  --   CALCIUM 8.9  --   AST 180* 152*  ALT 239* 235*  ALKPHOS 107 118  BILITOT 6.8* 5.7*   Cardiac Enzymes  Recent Labs Lab 12/30/16 1605  TROPONINI <0.03   RADIOLOGY:  Nm Hepatobiliary Liver Func  Result Date: 12/31/2016 CLINICAL DATA:  Cholelithiasis with right upper quadrant pain. EXAM: NUCLEAR MEDICINE HEPATOBILIARY IMAGING TECHNIQUE: Sequential images of the abdomen were obtained out to 60 minutes following intravenous administration of radiopharmaceutical. RADIOPHARMACEUTICALS:  5.1 mCi Tc-65m  Choletec IV COMPARISON:  MRI from 12/30/2016. FINDINGS: Prompt uptake and biliary excretion of activity by the liver is seen. Gallbladder activity is visualized by 20-25 minutes, consistent with patency of cystic duct. Biliary activity passes into small bowel, consistent with patent common bile duct. IMPRESSION: Normal hepatobiliary patency scan. No evidence for cystic duct obstruction. Electronically Signed   By: Misty Stanley M.D.   On: 12/31/2016 09:12   Mr 3d Recon At Scanner  Result Date: 12/30/2016 CLINICAL DATA:  Epigastric/  upper abdominal pain, cholelithiasis EXAM: MRI ABDOMEN WITHOUT AND WITH CONTRAST (INCLUDING MRCP) TECHNIQUE: Multiplanar multisequence MR imaging of the abdomen was performed both before and after the administration of intravenous contrast. Heavily T2-weighted images of the biliary and pancreatic ducts were obtained,  and three-dimensional MRCP images were rendered by post processing. CONTRAST:  21mL MULTIHANCE GADOBENATE DIMEGLUMINE 529 MG/ML IV SOLN COMPARISON:  Right upper quadrant ultrasound dated 12/30/2016 FINDINGS: Lower chest: Lung bases are clear. Hepatobiliary: Moderate hepatic steatosis. No suspicious/enhancing hepatic lesions. Layering small gallstones measuring up to 10 mm (series 4/ image 15). No associated inflammatory changes. No intrahepatic or extrahepatic ductal dilatation. Common duct measures 5 mm. No choledocholithiasis is seen. Pancreas:  Within normal limits. Spleen:  Within normal limits. Adrenals/Urinary Tract:  Adrenal glands are within normal limits. Kidneys are within normal limits.  No hydronephrosis. Stomach/Bowel: Stomach is within normal limits. Visualized bowel is unremarkable. Vascular/Lymphatic:  No evidence of abdominal aortic aneurysm. No suspicious abdominal lymphadenopathy. Other:  No abdominal ascites. Musculoskeletal: No focal osseous lesions IMPRESSION: Cholelithiasis, without associated inflammatory changes. No intrahepatic or extrahepatic ductal dilatation. Common duct measures 5 mm. No choledocholithiasis is seen. Moderate hepatic steatosis. Electronically Signed   By: Julian Hy M.D.   On: 12/30/2016 21:14   Mr Abdomen Mrcp Moise Boring Contast  Result Date: 12/30/2016 CLINICAL DATA:  Epigastric/ upper abdominal pain, cholelithiasis EXAM: MRI ABDOMEN WITHOUT AND WITH CONTRAST (INCLUDING MRCP) TECHNIQUE: Multiplanar multisequence MR imaging of the abdomen was performed both before and after the administration of intravenous contrast. Heavily T2-weighted images of the biliary and pancreatic ducts were obtained, and three-dimensional MRCP images were rendered by post processing. CONTRAST:  51mL MULTIHANCE GADOBENATE DIMEGLUMINE 529 MG/ML IV SOLN COMPARISON:  Right upper quadrant ultrasound dated 12/30/2016 FINDINGS: Lower chest: Lung bases are clear. Hepatobiliary: Moderate hepatic  steatosis. No suspicious/enhancing hepatic lesions. Layering small gallstones measuring up to 10 mm (series 4/ image 15). No associated inflammatory changes. No intrahepatic or extrahepatic ductal dilatation. Common duct measures 5 mm. No choledocholithiasis is seen. Pancreas:  Within normal limits. Spleen:  Within normal limits. Adrenals/Urinary Tract:  Adrenal glands are within normal limits. Kidneys are within normal limits.  No hydronephrosis. Stomach/Bowel: Stomach is within normal limits. Visualized bowel is unremarkable. Vascular/Lymphatic:  No evidence of abdominal aortic aneurysm. No suspicious abdominal lymphadenopathy. Other:  No abdominal ascites. Musculoskeletal: No focal osseous lesions IMPRESSION: Cholelithiasis, without associated inflammatory changes. No intrahepatic or extrahepatic ductal dilatation. Common duct measures 5 mm. No choledocholithiasis is seen. Moderate hepatic steatosis. Electronically Signed   By: Julian Hy M.D.   On: 12/30/2016 21:14   US Abdomen Limited Ruq  Result Date: 12/30/2016 CLINICAL DATA:  Right upper quadrant pain x3 days EXAM: ULTRASOUND ABDOMEN LIMITED RIGHT UPPER QUADRANT COMPARISON:  12/29/2016 FINDINGS: Gallbladder: Multiple gallstones measuring up to 11 mm. No gallbladder wall thickening or pericholecystic fluid. Negative sonographic Murphy's sign. Common bile duct: Diameter: 5 mm Liver: Hyperechoic hepatic parenchyma.  No focal hepatic lesion is seen. IMPRESSION: Cholelithiasis, without associated inflammatory changes to suggest acute cholecystitis. Suspected hepatic steatosis. Electronically Signed   By: Julian Hy M.D.   On: 12/30/2016 17:29   ASSESSMENT AND PLAN:  51 year old white male being admitted for abdominal pain and elevated total bilirubin.    * Acute transaminitis - primary liver disease versus gallbladder disease - Appreciate GI and surgical consultation - Case discussed with Dr. Vicente Males  - HIDA scan, MRCP abd negative for CBD  obstruction.  -Hepatitis panel, ceruloplasmin neg -[place] abd shows layering  of Gallstones--- ?bilirary colic -Dr Hampton Abbot plans CCK in am---->transfer of service to surgery.  * Elevated total bilirubin - Workup as above  * Abdominal pain and nausea - Unknown etiology.  Could be related to gallbladder/gallstones.  As his pain is only when he eats  * DJD of the knees - No acute pain, outpatient evaluation   Case discussed with Care Management/Social Worker. Management plans discussed with the patient, family and they are in agreement.  CODE STATUS: full DVT Prophylaxis: lovenox  TOTAL TIME TAKING CARE OF THIS PATIENT: 30 minutes.  >50% time spent on counselling and coordination of care  POSSIBLE D/C IN *1-2* DAYS, DEPENDING ON CLINICAL CONDITION.  Note: This dictation was prepared with Dragon dictation along with smaller phrase technology. Any transcriptional errors that result from this process are unintentional.  Malvern Kadlec M.D on 01/01/2017 at 11:53 AM  Between 7am to 6pm - Pager - [phone]  After 6pm go to [email or web address] - password [email or web address] [facility]  Office  [phone]  CC: Primary care physician; Katheren Shams

## 2017-01-01 NOTE — Progress Notes (Signed)
Gastroenterology Progress Note    Patrick Peterson 51 y.o. Aug 31, 1965   Subjective: Feels good. Denies abdominal pain, N/V.  Objective: Vital signs in last 24 hours: Vitals:   01/01/17 0417 01/01/17 0930  BP: (!) 141/87 114/85  Pulse: (!) 54 60  Resp: 16 16  Temp: 98 F (36.7 C) (!) 97.5 F (36.4 C)    Physical Exam: Gen: alert, no acute distress, well-nourished HEENT: anicteric sclera CV: RRR Chest: CTA B Abd: soft, nontender, nondistended, +BS Ext: no edema  Lab Results:  Recent Labs  12/30/16 1605 12/31/16 0342  NA 137 140  K 3.8 4.0  CL 105 108  CO2 24 25  GLUCOSE 117* 99  BUN 14 12  CREATININE 0.86 0.87  CALCIUM 9.3 8.9    Recent Labs  12/31/16 0342 01/01/17 0349  AST 180* 152*  ALT 239* 235*  ALKPHOS 107 118  BILITOT 6.8* 5.7*  PROT 6.6 7.1  ALBUMIN 3.7 3.9    Recent Labs  12/30/16 1605 12/31/16 0342  WBC 7.6 7.4  HGB 16.3 15.8  HCT 47.3 45.0  MCV 90.3 91.1  PLT 175 149*    Recent Labs  12/30/16 2254 12/31/16 0342  LABPROT 13.4 13.3  INR 1.02 1.01      Assessment/Plan: Biliary colic - MRCP negative for CBD stone and common duct not dilated. Gallstones noted without inflammation. I think he passed a gallstone. Hep A,B, C negative. AST/ALT slowly improving. TB improving. Slight rise in ALP. Agree with GB surgery and would do IOC. Surgery reportedly planned for today. Will sign off. Call us back if questions.   Patrick C. 01/01/2017, 11:36 AMPatient ID: Patrick Peterson, male   DOB: Sep 27, 1965, 51 y.o.   MRN: [id number]

## 2017-01-01 NOTE — Progress Notes (Signed)
Pt's MRSA PCR came back positive for staph aureus. Per standing orders, I have started pt on bactroban ointment and CHG wipes. Dr. Hampton Abbot aware and agreeable with these orders.

## 2017-01-01 NOTE — Progress Notes (Signed)
Pt asking to shower. Per Dr. Posey Pronto, pt may shower. Order placed in EPIC.

## 2017-01-01 NOTE — Progress Notes (Signed)
01/01/2017  Subjective: No acute events overnight.  Patient had some pain and nausea after eating lunch yesterday.  LFTs today are improving.  Vital signs: Temp:  [98 F (36.7 C)-98.2 F (36.8 C)] 98 F (36.7 C) (08/04 0417) Pulse Rate:  [52-58] 54 (08/04 0417) Resp:  [12-16] 16 (08/04 0417) BP: (132-147)/(87-89) 141/87 (08/04 0417) SpO2:  [96 %-97 %] 97 % (08/04 0417) Weight:  [128.3 kg (282 lb 12.8 oz)] 128.3 kg (282 lb 12.8 oz) (08/04 0417)   Intake/Output: 08/03 0701 - 08/04 0700 In: 240 [P.O.:240] Out: -  Last BM Date: 12/31/16  Physical Exam: Constitutional: No acute distress Abdomen:  Soft, nondistended, currently nontender to palpation.  Labs:   Recent Labs  12/30/16 1605 12/31/16 0342  WBC 7.6 7.4  HGB 16.3 15.8  HCT 47.3 45.0  PLT 175 149*    Recent Labs  12/30/16 1605 12/31/16 0342  NA 137 140  K 3.8 4.0  CL 105 108  CO2 24 25  GLUCOSE 117* 99  BUN 14 12  CREATININE 0.86 0.87  CALCIUM 9.3 8.9    Recent Labs  12/30/16 2254 12/31/16 0342  LABPROT 13.4 13.3  INR 1.02 1.01    Imaging: Nm Hepatobiliary Liver Func  Result Date: 12/31/2016 CLINICAL DATA:  Cholelithiasis with right upper quadrant pain. EXAM: NUCLEAR MEDICINE HEPATOBILIARY IMAGING TECHNIQUE: Sequential images of the abdomen were obtained out to 60 minutes following intravenous administration of radiopharmaceutical. RADIOPHARMACEUTICALS:  5.1 mCi Tc-5m  Choletec IV COMPARISON:  MRI from 12/30/2016. FINDINGS: Prompt uptake and biliary excretion of activity by the liver is seen. Gallbladder activity is visualized by 20-25 minutes, consistent with patency of cystic duct. Biliary activity passes into small bowel, consistent with patent common bile duct. IMPRESSION: Normal hepatobiliary patency scan. No evidence for cystic duct obstruction. Electronically Signed   By: Misty Stanley M.D.   On: 12/31/2016 09:12    Assessment/Plan: 51 yo male with elevated LFTs, possible liver pathology vs  passing gallstones.  --LFTs improving, and so far liver studies are coming back negative.  More studies pending. --Discussed with patient that as long as studies continue to be negative and LFTs improving, that tomorrow could go to OR for cholecystectomy.  Will make NPO after midnight with IV fluids, and order abx on call to OR.  Risks and benefits of the procedure were discussed with the patient and he is willing to proceed.   [name], [place]

## 2017-01-02 ENCOUNTER — Inpatient Hospital Stay: Payer: 59

## 2017-01-02 ENCOUNTER — Inpatient Hospital Stay: Payer: 59 | Admitting: Anesthesiology

## 2017-01-02 ENCOUNTER — Encounter
Admission: EM | Disposition: A | Discharge status: Home / Self Care | Payer: Self-pay | Source: Home / Self Care | Attending: Surgery

## 2017-01-02 ENCOUNTER — Encounter: Payer: Self-pay | Admitting: Anesthesiology

## 2017-01-02 HISTORY — PX: CHOLECYSTECTOMY: SHX55

## 2017-01-02 LAB — HEPATIC FUNCTION PANEL
ALT: 236 U/L — ABNORMAL HIGH (ref 17–63)
AST: 150 U/L — ABNORMAL HIGH (ref 15–41)
Albumin: 3.8 g/dL (ref 3.5–5.0)
Alkaline Phosphatase: 127 U/L — ABNORMAL HIGH (ref 38–126)
BILIRUBIN INDIRECT: 3.1 mg/dL — AB (ref 0.3–0.9)
Bilirubin, Direct: 3 mg/dL — ABNORMAL HIGH (ref 0.1–0.5)
TOTAL PROTEIN: 7.1 g/dL (ref 6.5–8.1)
Total Bilirubin: 6.1 mg/dL — ABNORMAL HIGH (ref 0.3–1.2)

## 2017-01-02 LAB — HEPATITIS C VRS RNA DETECT BY PCR-QUAL: Hepatitis C Vrs RNA by PCR-Qual: NEGATIVE

## 2017-01-02 LAB — GLUCOSE, CAPILLARY: GLUCOSE-CAPILLARY: 116 mg/dL — AB (ref 65–99)

## 2017-01-02 LAB — ANTI-SMOOTH MUSCLE ANTIBODY, IGG: F-ACTIN AB IGG: 11 U (ref 0–19)

## 2017-01-02 SURGERY — LAPAROSCOPIC CHOLECYSTECTOMY WITH INTRAOPERATIVE CHOLANGIOGRAM
Anesthesia: General

## 2017-01-02 MED ORDER — LIDOCAINE HCL (PF) 2 % IJ SOLN
INTRAMUSCULAR | Status: AC
  Filled 2017-01-02: qty 2

## 2017-01-02 MED ORDER — GLUCAGON HCL RDNA (DIAGNOSTIC) 1 MG IJ SOLR
INTRAMUSCULAR | Status: DC | PRN
  Administered 2017-01-02: 1 mg via INTRAVENOUS

## 2017-01-02 MED ORDER — MIDAZOLAM HCL 2 MG/2ML IJ SOLN
INTRAMUSCULAR | Status: DC | PRN
  Administered 2017-01-02: 2 mg via INTRAVENOUS

## 2017-01-02 MED ORDER — PROPOFOL 10 MG/ML IV BOLUS
INTRAVENOUS | Status: AC
  Filled 2017-01-02: qty 20

## 2017-01-02 MED ORDER — GLUCAGON HCL RDNA (DIAGNOSTIC) 1 MG IJ SOLR
INTRAMUSCULAR | Status: AC
  Filled 2017-01-02: qty 1

## 2017-01-02 MED ORDER — KETOROLAC TROMETHAMINE 30 MG/ML IJ SOLN
INTRAMUSCULAR | Status: DC | PRN
  Administered 2017-01-02: 30 mg via INTRAVENOUS

## 2017-01-02 MED ORDER — FENTANYL CITRATE (PF) 100 MCG/2ML IJ SOLN
INTRAMUSCULAR | Status: DC | PRN
  Administered 2017-01-02 (×5): 50 ug via INTRAVENOUS

## 2017-01-02 MED ORDER — ONDANSETRON HCL 4 MG/2ML IJ SOLN
INTRAMUSCULAR | Status: AC
  Filled 2017-01-02: qty 2

## 2017-01-02 MED ORDER — SUCCINYLCHOLINE CHLORIDE 20 MG/ML IJ SOLN
INTRAMUSCULAR | Status: DC | PRN
  Administered 2017-01-02: 200 mg via INTRAVENOUS

## 2017-01-02 MED ORDER — MIDAZOLAM HCL 2 MG/2ML IJ SOLN
INTRAMUSCULAR | Status: AC
  Filled 2017-01-02: qty 2

## 2017-01-02 MED ORDER — ONDANSETRON HCL 4 MG/2ML IJ SOLN: 4.0000 mg | Freq: Once | INTRAMUSCULAR | Status: DC | PRN

## 2017-01-02 MED ORDER — DEXTROSE 5 % IV SOLN
2.0000 g | INTRAVENOUS | Status: AC
  Administered 2017-01-02: 2 g via INTRAVENOUS
  Filled 2017-01-02: qty 2

## 2017-01-02 MED ORDER — LIDOCAINE HCL (CARDIAC) 20 MG/ML IV SOLN
INTRAVENOUS | Status: DC | PRN
  Administered 2017-01-02: 100 mg via INTRAVENOUS

## 2017-01-02 MED ORDER — DEXAMETHASONE SODIUM PHOSPHATE 10 MG/ML IJ SOLN
INTRAMUSCULAR | Status: DC | PRN
  Administered 2017-01-02: 10 mg via INTRAVENOUS

## 2017-01-02 MED ORDER — ROCURONIUM BROMIDE 50 MG/5ML IV SOLN
INTRAVENOUS | Status: AC
Start: 2017-01-02 — End: 2017-01-02
  Filled 2017-01-02: qty 1

## 2017-01-02 MED ORDER — PROPOFOL 10 MG/ML IV BOLUS
INTRAVENOUS | Status: DC | PRN
  Administered 2017-01-02: 250 mg via INTRAVENOUS

## 2017-01-02 MED ORDER — FENTANYL CITRATE (PF) 250 MCG/5ML IJ SOLN
INTRAMUSCULAR | Status: AC
  Filled 2017-01-02: qty 5

## 2017-01-02 MED ORDER — EPINEPHRINE PF 1 MG/ML IJ SOLN
INTRAMUSCULAR | Status: AC
  Filled 2017-01-02: qty 1

## 2017-01-02 MED ORDER — SEVOFLURANE IN SOLN
RESPIRATORY_TRACT | Status: AC
Start: 2017-01-02 — End: 2017-01-02
  Filled 2017-01-02: qty 250

## 2017-01-02 MED ORDER — BUPIVACAINE HCL (PF) 0.25 % IJ SOLN
INTRAMUSCULAR | Status: AC
  Filled 2017-01-02: qty 30

## 2017-01-02 MED ORDER — SUGAMMADEX SODIUM 500 MG/5ML IV SOLN
INTRAVENOUS | Status: DC | PRN
  Administered 2017-01-02: 500 mg via INTRAVENOUS

## 2017-01-02 MED ORDER — ACETAMINOPHEN 10 MG/ML IV SOLN
INTRAVENOUS | Status: DC | PRN
  Administered 2017-01-02: 1000 mg via INTRAVENOUS

## 2017-01-02 MED ORDER — FENTANYL CITRATE (PF) 100 MCG/2ML IJ SOLN: 25.0000 ug | INTRAMUSCULAR | Status: DC | PRN

## 2017-01-02 MED ORDER — PHENYLEPHRINE HCL 10 MG/ML IJ SOLN
INTRAMUSCULAR | Status: DC | PRN
  Administered 2017-01-02 (×2): 100 ug via INTRAVENOUS

## 2017-01-02 MED ORDER — KETOROLAC TROMETHAMINE 30 MG/ML IJ SOLN
INTRAMUSCULAR | Status: AC
Start: 2017-01-02 — End: 2017-01-02
  Filled 2017-01-02: qty 1

## 2017-01-02 MED ORDER — ACETAMINOPHEN 10 MG/ML IV SOLN
INTRAVENOUS | Status: AC
  Filled 2017-01-02: qty 100

## 2017-01-02 MED ORDER — MORPHINE SULFATE (PF) 2 MG/ML IV SOLN
2.0000 mg | INTRAVENOUS | Status: DC | PRN
  Administered 2017-01-02: 2 mg via INTRAVENOUS
  Filled 2017-01-02: qty 1

## 2017-01-02 MED ORDER — SUCCINYLCHOLINE CHLORIDE 20 MG/ML IJ SOLN
INTRAMUSCULAR | Status: AC
  Filled 2017-01-02: qty 1

## 2017-01-02 MED ORDER — BUPIVACAINE-EPINEPHRINE 0.25% -1:200000 IJ SOLN
INTRAMUSCULAR | Status: DC | PRN
  Administered 2017-01-02: 30 mL

## 2017-01-02 MED ORDER — DEXAMETHASONE SODIUM PHOSPHATE 10 MG/ML IJ SOLN
INTRAMUSCULAR | Status: AC
  Filled 2017-01-02: qty 1

## 2017-01-02 MED ORDER — ROCURONIUM BROMIDE 100 MG/10ML IV SOLN
INTRAVENOUS | Status: DC | PRN
  Administered 2017-01-02: 40 mg via INTRAVENOUS
  Administered 2017-01-02: 50 mg via INTRAVENOUS
  Administered 2017-01-02: 10 mg via INTRAVENOUS

## 2017-01-02 SURGICAL SUPPLY — 49 items
ADH SKN CLS APL DERMABOND .7 (GAUZE/BANDAGES/DRESSINGS) ×1
APPLIER CLIP 5 13 M/L LIGAMAX5 (MISCELLANEOUS) ×3
APR CLP MED LRG 5 ANG JAW (MISCELLANEOUS) ×1
BAG SPEC RTRVL LRG 6X4 10 (ENDOMECHANICALS) ×1
BLADE SURG 15 STRL LF DISP TIS (BLADE) ×1 IMPLANT
BLADE SURG 15 STRL SS (BLADE) ×3
CANISTER SUCT 1200ML W/VALVE (MISCELLANEOUS) ×1 IMPLANT
CANISTER SUCT 3000ML (MISCELLANEOUS) ×2 IMPLANT
CATH CHOLANGI 4FR 420404F (CATHETERS) IMPLANT
CHLORAPREP W/TINT 26ML (MISCELLANEOUS) ×3 IMPLANT
CLIP APPLIE 5 13 M/L LIGAMAX5 (MISCELLANEOUS) ×1 IMPLANT
CONRAY 60ML FOR OR (MISCELLANEOUS) ×3 IMPLANT
DERMABOND ADVANCED (GAUZE/BANDAGES/DRESSINGS) ×2
DERMABOND ADVANCED .7 DNX12 (GAUZE/BANDAGES/DRESSINGS) ×1 IMPLANT
DRAPE C-ARM XRAY 36X54 (DRAPES) ×2 IMPLANT
ELECT REM PT RETURN 9FT ADLT (ELECTROSURGICAL) ×3
ELECTRODE REM PT RTRN 9FT ADLT (ELECTROSURGICAL) ×1 IMPLANT
FILTER LAP SMOKE EVAC STRL (MISCELLANEOUS) ×3 IMPLANT
GLOVE SURG SYN 7.0 (GLOVE) ×12 IMPLANT
GLOVE SURG SYN 7.0 PF PI (GLOVE) ×1 IMPLANT
GLOVE SURG SYN 7.5  E (GLOVE) ×2
GLOVE SURG SYN 7.5 E (GLOVE) ×1 IMPLANT
GLOVE SURG SYN 7.5 PF PI (GLOVE) ×1 IMPLANT
GOWN STRL REUS W/ TWL LRG LVL3 (GOWN DISPOSABLE) ×3 IMPLANT
GOWN STRL REUS W/TWL LRG LVL3 (GOWN DISPOSABLE) ×6
IRRIGATION STRYKERFLOW (MISCELLANEOUS) IMPLANT
IRRIGATOR STRYKERFLOW (MISCELLANEOUS)
IV CATH ANGIO 12GX3 LT BLUE (NEEDLE) ×3 IMPLANT
IV NS 1000ML (IV SOLUTION)
IV NS 1000ML BAXH (IV SOLUTION) IMPLANT
JACKSON PRATT 10 (INSTRUMENTS) IMPLANT
L-HOOK LAP DISP 36CM (ELECTROSURGICAL) ×3
LABEL OR SOLS (LABEL) ×3 IMPLANT
LHOOK LAP DISP 36CM (ELECTROSURGICAL) ×1 IMPLANT
NDL SAFETY 22GX1.5 (NEEDLE) ×3 IMPLANT
NEEDLE HYPO 22GX1.5 SAFETY (NEEDLE) ×3 IMPLANT
PACK LAP CHOLECYSTECTOMY (MISCELLANEOUS) ×3 IMPLANT
PENCIL ELECTRO HAND CTR (MISCELLANEOUS) ×3 IMPLANT
POUCH SPECIMEN RETRIEVAL 10MM (ENDOMECHANICALS) ×3 IMPLANT
SCISSORS METZENBAUM CVD 33 (INSTRUMENTS) ×3 IMPLANT
SLEEVE ADV FIXATION 5X100MM (TROCAR) ×9 IMPLANT
SPONGE VERSALON 4X4 4PLY (MISCELLANEOUS) IMPLANT
SUT MNCRL 4-0 (SUTURE) ×3
SUT MNCRL 4-0 27XMFL (SUTURE) ×1
SUT VICRYL 0 AB UR-6 (SUTURE) ×3 IMPLANT
SUTURE MNCRL 4-0 27XMF (SUTURE) ×1 IMPLANT
TROCAR 130MM GELPORT  DAV (MISCELLANEOUS) ×3 IMPLANT
TROCAR Z-THREAD OPTICAL 5X100M (TROCAR) ×3 IMPLANT
TUBING INSUFFLATOR HI FLOW (MISCELLANEOUS) ×3 IMPLANT

## 2017-01-02 NOTE — Anesthesia Post-op Follow-up Note (Cosign Needed)
Anesthesia QCDR form completed.        

## 2017-01-02 NOTE — Anesthesia Postprocedure Evaluation (Signed)
Anesthesia Post Note  Patient: Patrick Peterson  Procedure(s) Performed: Procedure(s) (LRB): LAPAROSCOPIC CHOLECYSTECTOMY WITH INTRAOPERATIVE CHOLANGIOGRAM (N/A)  Patient location during evaluation: PACU Anesthesia Type: General Level of consciousness: awake and alert Pain management: pain level controlled Vital Signs Assessment: post-procedure vital signs reviewed and stable Respiratory status: spontaneous breathing, nonlabored ventilation, respiratory function stable and patient connected to nasal cannula oxygen Cardiovascular status: blood pressure returned to baseline and stable Postop Assessment: no signs of nausea or vomiting Anesthetic complications: no     Last Vitals:  Vitals:   01/02/17 1210 01/02/17 1422  BP: (!) 151/88 (!) 146/88  Pulse: 70 (!) 52  Resp: 12 18  Temp:  36.8 C    Last Pain:  Vitals:   01/02/17 1422  TempSrc: Oral  PainSc:                  Othel Hoogendoorn S

## 2017-01-02 NOTE — Progress Notes (Signed)
01/02/2017  Subjective: Patient started having worse pain this morning, likely another stone trying to pass.  His total bilirubin did go up slightly this morning.  Vital signs: Temp:  [97.5 F (36.4 C)-98.3 F (36.8 C)] 98.3 F (36.8 C) (08/05 0615) Pulse Rate:  [57-64] 64 (08/05 0615) Resp:  [16-20] 20 (08/05 0615) BP: (114-152)/(78-93) 152/78 (08/05 0615) SpO2:  [95 %-100 %] 95 % (08/05 0615)   Intake/Output: 08/04 0701 - 08/05 0700 In: 585 [P.O.:240; I.V.:345] Out: 650 [Urine:650] Last BM Date: 01/01/17  Physical Exam: Constitutional: No acute distress Abdomen:  Soft, nondistended, tender to palpation in epigastric and right upper quadrant.  Labs:   Recent Labs  12/30/16 1605 12/31/16 0342  WBC 7.6 7.4  HGB 16.3 15.8  HCT 47.3 45.0  PLT 175 149*    Recent Labs  12/30/16 1605 12/31/16 0342  NA 137 140  K 3.8 4.0  CL 105 108  CO2 24 25  GLUCOSE 117* 99  BUN 14 12  CREATININE 0.86 0.87  CALCIUM 9.3 8.9    Recent Labs  12/30/16 2254 12/31/16 0342  LABPROT 13.4 13.3  INR 1.02 1.01    Imaging: No results found.  Assessment/Plan: 51 yo male with hyperbilirubinemia, likely due to gallstones  --will take to OR today for laparoscopic cholecystectomy.  Will also perform an intraoperative cholangiogram.  The patient is aware that if he does have a CBD stone and I am unable to flush it down, that he would require an ERCP tomorrow.   Patrick Peterson, Patrick Peterson

## 2017-01-02 NOTE — Anesthesia Preprocedure Evaluation (Signed)
Anesthesia Evaluation  Patient identified by MRN, date of birth, ID band Patient awake    Reviewed: Allergy & Precautions, NPO status , Patient's Chart, lab work & pertinent test results, reviewed documented beta blocker date and time   Airway Mallampati: III  TM Distance: >3 FB     Dental  (+) Chipped   Pulmonary Current Smoker,           Cardiovascular      Neuro/Psych    GI/Hepatic   Endo/Other    Renal/GU      Musculoskeletal   Abdominal   Peds  Hematology   Anesthesia Other Findings Obese.  Reproductive/Obstetrics                            Anesthesia Physical Anesthesia Plan  ASA: III  Anesthesia Plan: General   Post-op Pain Management:    Induction: Intravenous  PONV Risk Score and Plan:   Airway Management Planned: Oral ETT  Additional Equipment:   Intra-op Plan:   Post-operative Plan:   Informed Consent: I have reviewed the patients History and Physical, chart, labs and discussed the procedure including the risks, benefits and alternatives for the proposed anesthesia with the patient or authorized representative who has indicated his/her understanding and acceptance.     Plan Discussed with: CRNA  Anesthesia Plan Comments:         Anesthesia Quick Evaluation  

## 2017-01-02 NOTE — Transfer of Care (Signed)
Immediate Anesthesia Transfer of Care Note  Patient: Patrick Peterson  Procedure(s) Performed: Procedure(s): LAPAROSCOPIC CHOLECYSTECTOMY WITH INTRAOPERATIVE CHOLANGIOGRAM (N/A)  Patient Location: PACU  Anesthesia Type:General  Level of Consciousness: awake and sedated  Airway & Oxygen Therapy: Patient Spontanous Breathing and Patient connected to face mask oxygen  Post-op Assessment: Report given to RN and Post -op Vital signs reviewed and stable  Post vital signs: Reviewed and stable  Last Vitals:  Vitals:   01/01/17 2139 01/02/17 0615  BP: (!) 144/93 (!) 152/78  Pulse: (!) 59 64  Resp: 20 20  Temp: 36.8 C 36.8 C    Last Pain:  Vitals:   01/02/17 0714  TempSrc:   PainSc: 9          Complications: No apparent anesthesia complications

## 2017-01-02 NOTE — Op Note (Signed)
Procedure Date:  01/02/2017  Pre-operative Diagnosis:  Cholelithiasis with hyperbilirubinemia  Post-operative Diagnosis:  Cholelithiasis with hyperbilirubinemia  Procedure:  Laparoscopic cholecystectomy with intraoperative cholangiogram  Surgeon:  [name], MD  Anesthesia:  General endotracheal  Estimated Blood Loss:  10 ml  Specimens:  gallbladder  Complications:  None  Indications for Procedure:  This is a 51 y.o. male who presents with abdominal pain and workup revealing cholelithiasis and hyperbilirubinemia, without choledocholithiasis on MRCP, believed to be passing gallstones.  The benefits, complications, treatment options, and expected outcomes were discussed with the patient. The risks of bleeding, infection, recurrence of symptoms, failure to resolve symptoms, bile duct damage, bile duct leak, retained common bile duct stone, bowel injury, and need for further procedures were all discussed with the patient and he was willing to proceed.  Description of Procedure: The patient was correctly identified in the preoperative area and brought into the operating room.  The patient was placed supine with VTE prophylaxis in place.  Appropriate time-outs were performed.  Anesthesia was induced and the patient was intubated.  Appropriate antibiotics were infused.  The abdomen was prepped and draped in a sterile fashion. An infraumbilical incision was made. A cutdown technique was used to enter the abdominal cavity without injury, and a Hasson trocar was inserted.  Pneumoperitoneum was obtained with appropriate opening pressures.  A 5-mm port was placed in the subxiphoid area and two 5-mm ports were placed in the right upper quadrant under direct visualization.  The gallbladder was identified.  The fundus was grasped and retracted cephalad.  Adhesions were lysed bluntly and with electrocautery. The infundibulum was grasped and retracted laterally, exposing the peritoneum overlying the  gallbladder.  This was incised with electrocautery and extended on either side of the gallbladder.  The cystic duct and both anterior and posterior branches of the cystic artery were clearly identified and bluntly dissected.  The cystic duct was clipped once distally and a ductotomy was created.  A 14Fr angiocath was placed in the right upper quadrant and the cholangiogram catheter passed through it.  Using [place] forceps, the catheter was introduced into the ductotomy and secured with a clip.  The C-arm was then brought into the field and a cholangiogram was performed showing no filling defects of the common bile duct, with tapering of the duct as it entered the duodenum, and contrast flowing into the duodenum.    After completion of the cholangiogram, the catheter was removed and the cystic duct was clipped twice proximally and cut in between.  The cystic artery branches were clipped and cut in same fashion.  The gallbladder was taken from the gallbladder fossa in a retrograde fashion with electrocautery. The gallbladder was placed in an Endocatch bag and brought out via the umbilical incision. The liver bed was inspected and any bleeding was controlled with electrocautery. The right upper quadrant was then inspected again revealing intact clips, no bleeding, and no ductal injury.  The area was thoroughly irrigated.  The 5 mm ports were removed under direct visualization and the Hasson trocar was removed.  The fascial opening was closed using 0 vicryl suture.  Local anesthetic was infused in all incisions and the incisions were closed with 4-0 Monocryl.  The wounds were cleaned and sealed with DermaBond.  The patient was emerged from anesthesia and extubated and brought to the recovery room for further management.  The patient tolerated the procedure well and all counts were correct at the end of the case.  [name], MD

## 2017-01-02 NOTE — Anesthesia Procedure Notes (Signed)
Procedure Name: Intubation Date/Time: 01/02/2017 9:10 AM Performed by: Nelda Marseille Pre-anesthesia Checklist: Patient identified, Patient being monitored, Timeout performed, Emergency Drugs available and Suction available Patient Re-evaluated:Patient Re-evaluated prior to induction Oxygen Delivery Method: Circle system utilized Preoxygenation: Pre-oxygenation with 100% oxygen Induction Type: IV induction Ventilation: Mask ventilation without difficulty Laryngoscope Size: Mac and 3 Grade View: Grade I Tube type: Oral Tube size: 7.5 mm Number of attempts: 1 Airway Equipment and Method: Stylet Placement Confirmation: ETT inserted through vocal cords under direct vision,  positive ETCO2 and breath sounds checked- equal and bilateral Secured at: 24 cm Tube secured with: Tape Dental Injury: Teeth and Oropharynx as per pre-operative assessment

## 2017-01-03 ENCOUNTER — Telehealth: Payer: Self-pay

## 2017-01-03 LAB — IMMUNOGLOBULINS A/E/G/M, SERUM
IGM, SERUM: 32 mg/dL (ref 20–172)
IgA: 252 mg/dL (ref 90–386)
IgE (Immunoglobulin E), Serum: 37 IU/mL (ref 0–100)
IgG (Immunoglobin G), Serum: 954 mg/dL (ref 700–1600)

## 2017-01-03 LAB — HEPATIC FUNCTION PANEL
ALK PHOS: 111 U/L (ref 38–126)
ALT: 216 U/L — AB (ref 17–63)
AST: 117 U/L — ABNORMAL HIGH (ref 15–41)
Albumin: 3.7 g/dL (ref 3.5–5.0)
BILIRUBIN DIRECT: 1.3 mg/dL — AB (ref 0.1–0.5)
BILIRUBIN INDIRECT: 2.8 mg/dL — AB (ref 0.3–0.9)
BILIRUBIN TOTAL: 4.1 mg/dL — AB (ref 0.3–1.2)
Total Protein: 6.8 g/dL (ref 6.5–8.1)

## 2017-01-03 LAB — CBC
HCT: 48.8 % (ref 40.0–52.0)
Hemoglobin: 16.5 g/dL (ref 13.0–18.0)
MCH: 31.7 pg (ref 26.0–34.0)
MCHC: 33.8 g/dL (ref 32.0–36.0)
MCV: 93.6 fL (ref 80.0–100.0)
Platelets: 165 10*3/uL (ref 150–440)
RBC: 5.21 MIL/uL (ref 4.40–5.90)
RDW: 13.7 % (ref 11.5–14.5)
WBC: 13.9 10*3/uL — ABNORMAL HIGH (ref 3.8–10.6)

## 2017-01-03 LAB — VARICELLA-ZOSTER BY PCR: VARICELLA-ZOSTER, PCR: NEGATIVE

## 2017-01-03 MED ORDER — HYDROCODONE-ACETAMINOPHEN 5-325 MG PO TABS: 1.0000 | ORAL_TABLET | ORAL | 0 refills | Status: DC | PRN

## 2017-01-03 NOTE — Discharge Summary (Signed)
  Patient ID: Patrick Peterson MRN: [id number] DOB/AGE: 1965/06/08 51 y.o.  Admit date: 12/30/2016 Discharge date: 01/03/2017   Discharge Diagnoses:  Active Problems:   Transaminitis   Hyperbilirubinemia   Procedures: Laparoscopic cholecystectomy  Hospital Course: 51 year old male admitted with right upper quadrant pain workup consistent with acute cholecystitis. Of note his LFTs were But it was predominantly in increasing indirect bilirubin. He underwent an uneventful laparoscopic cholecystectomy with cholangiogram and there was no evidence of any filling defects. He was kept overnight after his cholecystectomy and did very well. At the time of discharge he was ambulating, tolerating diet, his vital signs were stable and he was afebrile. His physical exam showed a male in no acute distress, awake and alert. Abdomen: Soft incision is healing well without evidence of infection or peritonitis. Extremities: No edema well perfused. Condition at the time of discharge is stable Discussed with him about following up with primary care physician regarding his increased in indirect bilirubin. He states that he has had some lab work done and he will continue to follow up with his primary care doctor   Disposition: 01-Home or Self Care  Discharge Instructions    Call MD for:  difficulty breathing, headache or visual disturbances    Complete by:  As directed    Call MD for:  extreme fatigue    Complete by:  As directed    Call MD for:  hives    Complete by:  As directed    Call MD for:  persistant dizziness or light-headedness    Complete by:  As directed    Call MD for:  persistant nausea and vomiting    Complete by:  As directed    Call MD for:  redness, tenderness, or signs of infection (pain, swelling, redness, odor or green/yellow discharge around incision site)    Complete by:  As directed    Call MD for:  severe uncontrolled pain    Complete by:  As directed    Call MD for:  temperature  >100.4    Complete by:  As directed    Diet - low sodium heart healthy    Complete by:  As directed    Discharge instructions    Complete by:  As directed    Shower starting tomorrow, please provide pt w work excuse until next F/U appt   Increase activity slowly    Complete by:  As directed    Lifting restrictions    Complete by:  As directed    20 lbs x 6 wks   No dressing needed    Complete by:  As directed      Allergies as of 01/03/2017   No Known Allergies     Medication List    TAKE these medications   HYDROcodone-acetaminophen 5-325 MG tablet Commonly known as:  NORCO/VICODIN Take 1-2 tablets by mouth every 4 (four) hours as needed for moderate pain. Notes to patient:  Last dose given today at 4:30am      Follow-up [place], MD Follow up in 1 week(s).   Specialty:  Surgery Contact information: [place] [address] [phone]            Caroleen Hamman, MD FACS

## 2017-01-03 NOTE — Telephone Encounter (Signed)
[facility] nurse called wanting to know how long patient needed to be off from work. I told her that patient's usually take 2 weeks off after surgery but has a 15 lbs lifting restriction for 4 weeks. Then after that, the patient will resume to normal activities unless he has any type of complications. She understood and had no further questions.

## 2017-01-03 NOTE — Progress Notes (Signed)
Discharge order received. Patient is alert and oriented. Vital signs stable . No signs of acute distress. Discharge instructions given. Patient verbalized understanding. Work note provided per Md 's approval. No other issues noted at this time.

## 2017-01-04 ENCOUNTER — Telehealth: Payer: Self-pay

## 2017-01-04 LAB — HSV(HERPES SIMPLEX VRS) I + II AB-IGM

## 2017-01-04 LAB — SURGICAL PATHOLOGY

## 2017-01-04 NOTE — Telephone Encounter (Signed)
Post-op call made to patient at this time. No answer. Left voicemail for return phone call.  

## 2017-01-04 NOTE — Telephone Encounter (Signed)
Post-op call made to patient at this time. Spoke with patient's wife. Post-op interview questions below.  1. How are you feeling? Sore  2. Is your pain controlled? Yes  3. What are you doing for the pain? Taking Pain  Medication PRN.  4. Are you having any Nausea or Vomiting? No  5. Are you having any Fever or Chills? No  6. Are you having any Constipation or Diarrhea? No  7. Is there any Swelling or Bruising you are  concerned about? No  8. Do you have any questions or concerns at this  time? No   Discussion: Reminder of Post-op appointment given. Discussed flatulence may continue up to 72 hours but patient may use Miralax or Simethicone to help have BM or to help with gas.

## 2017-01-05 LAB — CMV DNA BY PCR, QUALITATIVE: CMV DNA, Qual PCR: NEGATIVE

## 2017-01-06 ENCOUNTER — Telehealth: Payer: Self-pay | Admitting: General Practice

## 2017-01-06 NOTE — Telephone Encounter (Signed)
Patient's disability form was filled out. Please contact patient so he could pay the $25 fee and then if you could please fax it. Thank you.  Forms left at your folder.

## 2017-01-06 NOTE — Telephone Encounter (Signed)
We have received disability paperwork on patient, I have placed paperwork in your folder.

## 2017-01-10 NOTE — Telephone Encounter (Signed)
Patient came by and paid his 25.00 fee and picked up disability paperwork.

## 2017-01-14 ENCOUNTER — Other Ambulatory Visit: Payer: Self-pay

## 2017-01-17 ENCOUNTER — Encounter: Payer: Self-pay | Admitting: Surgery

## 2017-01-17 ENCOUNTER — Ambulatory Visit (INDEPENDENT_AMBULATORY_CARE_PROVIDER_SITE_OTHER): Payer: 59 | Admitting: Surgery

## 2017-01-17 VITALS — BP 116/72 | HR 85 | Temp 98.1°F | Ht 74.0 in | Wt 282.2 lb

## 2017-01-17 DIAGNOSIS — Z09 Encounter for follow-up examination after completed treatment for conditions other than malignant neoplasm: Secondary | ICD-10-CM

## 2017-01-17 NOTE — Progress Notes (Signed)
01/17/2017  HPI: Patient is status post laparoscopic cholecystectomy on 8/5. Today for postop follow-up. Denies any nausea or vomiting. Tolerates a regular diet. Denies abdominal pain. Denies any fevers or chills.  Vital signs: BP 116/72   Pulse 85   Temp 98.1 F (36.7 C) (Oral)   Ht 6\' 2"  (1.88 m)   Wt 128 kg (282 lb 3.2 oz)   BMI 36.23 kg/m    Physical Exam: Constitutional: No acute distress Abdomen:  Soft, nondistended, nontender to palpation. Incisions are clean dry and intact with exception of the right lateral incision which has opened mildly to 2 mm. There is no drainage from it.  Assessment/Plan: 51 year old male status post upper scopic cholecystectomy.  -Instructed the patient he is a has 2 more weeks of no heavy lifting or pushing of more than 10-15 pounds. -Gave return instructions if there is any issues with the wound such as infection or drainage. -We'll give him a work note for the additional lifting restriction. -patient follow up with [place] on an as-needed basis   [name], [place]

## 2017-01-17 NOTE — Patient Instructions (Signed)

## 2017-02-15 DIAGNOSIS — S83281A Other tear of lateral meniscus, current injury, right knee, initial encounter: Secondary | ICD-10-CM | POA: Diagnosis not present

## 2017-02-15 DIAGNOSIS — M25562 Pain in left knee: Secondary | ICD-10-CM | POA: Diagnosis not present

## 2017-02-22 DIAGNOSIS — M25561 Pain in right knee: Secondary | ICD-10-CM | POA: Diagnosis not present

## 2017-06-01 NOTE — Pre-Procedure Instructions (Signed)
Patrick Peterson  06/01/2017      [address] [place] [address] Phone: [phone] Fax: [phone]    Your procedure is scheduled on Monday January 14.  Report to [facility] Admitting at 5:30 A.M.  Call this number if you have problems the morning of surgery:  [phone]   Remember:  Do not eat food or drink liquids after midnight.  Take these medicines the morning of surgery with A SIP OF WATER: NONE  7 days prior to surgery STOP taking any Aspirin(unless otherwise instructed by your surgeon), Aleve, Naproxen, Ibuprofen, Motrin, Advil, Goody's, BC's, all herbal medications, fish oil, and all vitamins    Do not wear jewelry, make-up or nail polish.  Do not wear lotions, powders, or perfumes, or deodorant.  Do not shave 48 hours prior to surgery.  Men may shave face and neck.  Do not bring valuables to the hospital.  [facility] is not responsible for any belongings or valuables.  Contacts, dentures or bridgework may not be worn into surgery.  Leave your suitcase in the car.  After surgery it may be brought to your room.  For patients admitted to the hospital, discharge time will be determined by your treatment team.  Patients discharged the day of surgery will not be allowed to drive home.   Special instructions:    [place]- Preparing For Surgery  Before surgery, you can play an important role. Because skin is not sterile, your skin needs to be as free of germs as possible. You can reduce the number of germs on your skin by washing with CHG (chlorahexidine gluconate) Soap before surgery.  CHG is an antiseptic cleaner which kills germs and bonds with the skin to continue killing germs even after washing.  Please do not use if you have an allergy to CHG or antibacterial soaps. If your skin becomes reddened/irritated stop using the CHG.  Do not shave (including legs and underarms) for at  least 48 hours prior to first CHG shower. It is OK to shave your face.  Please follow these instructions carefully.   1. Shower the NIGHT BEFORE SURGERY and the MORNING OF SURGERY with CHG.   2. If you chose to wash your hair, wash your hair first as usual with your normal shampoo.  3. After you shampoo, rinse your hair and body thoroughly to remove the shampoo.  4. Use CHG as you would any other liquid soap. You can apply CHG directly to the skin and wash gently with a scrungie or a clean washcloth.   5. Apply the CHG Soap to your body ONLY FROM THE NECK DOWN.  Do not use on open wounds or open sores. Avoid contact with your eyes, ears, mouth and genitals (private parts). Wash Face and genitals (private parts)  with your normal soap.  6. Wash thoroughly, paying special attention to the area where your surgery will be performed.  7. Thoroughly rinse your body with warm water from the neck down.  8. DO NOT shower/wash with your normal soap after using and rinsing off the CHG Soap.  9. Pat yourself dry with a CLEAN TOWEL.  10. Wear CLEAN PAJAMAS to bed the night before surgery, wear comfortable clothes the morning of surgery  11. Place CLEAN SHEETS on your bed the night of your first shower and DO NOT SLEEP WITH PETS.    Day of Surgery: Do not apply any  deodorants/lotions. Please wear clean clothes to the hospital/surgery center.      Please read over the following fact sheets that you were given. Coughing and Deep Breathing, Total Joint Packet, MRSA Information and Surgical Site Infection Prevention

## 2017-06-02 ENCOUNTER — Encounter (HOSPITAL_COMMUNITY)
Admission: RE | Admit: 2017-06-02 | Discharge: 2017-06-02 | Disposition: A | Discharge status: Home / Self Care | Payer: 59 | Source: Ambulatory Visit | Attending: Orthopedic Surgery | Admitting: Orthopedic Surgery

## 2017-06-02 ENCOUNTER — Encounter (HOSPITAL_COMMUNITY): Payer: Self-pay

## 2017-06-02 ENCOUNTER — Other Ambulatory Visit: Payer: Self-pay

## 2017-06-02 DIAGNOSIS — Z01812 Encounter for preprocedural laboratory examination: Secondary | ICD-10-CM | POA: Diagnosis not present

## 2017-06-02 DIAGNOSIS — Z01810 Encounter for preprocedural cardiovascular examination: Secondary | ICD-10-CM | POA: Insufficient documentation

## 2017-06-02 HISTORY — DX: Personal history of urinary calculi: Z87.442

## 2017-06-02 HISTORY — DX: Unspecified osteoarthritis, unspecified site: M19.90

## 2017-06-02 HISTORY — DX: Gastro-esophageal reflux disease without esophagitis: K21.9

## 2017-06-02 LAB — COMPREHENSIVE METABOLIC PANEL
ALT: 47 U/L (ref 17–63)
AST: 39 U/L (ref 15–41)
Albumin: 4.2 g/dL (ref 3.5–5.0)
Alkaline Phosphatase: 70 U/L (ref 38–126)
Anion gap: 7 (ref 5–15)
BUN: 10 mg/dL (ref 6–20)
CO2: 28 mmol/L (ref 22–32)
CREATININE: 1.11 mg/dL (ref 0.61–1.24)
Calcium: 9.8 mg/dL (ref 8.9–10.3)
Chloride: 105 mmol/L (ref 101–111)
Glucose, Bld: 85 mg/dL (ref 65–99)
Potassium: 3.9 mmol/L (ref 3.5–5.1)
Sodium: 140 mmol/L (ref 135–145)
TOTAL PROTEIN: 7.2 g/dL (ref 6.5–8.1)
Total Bilirubin: 1.7 mg/dL — ABNORMAL HIGH (ref 0.3–1.2)

## 2017-06-02 LAB — CBC WITH DIFFERENTIAL/PLATELET
BASOS ABS: 0 10*3/uL (ref 0.0–0.1)
Basophils Relative: 1 %
EOS PCT: 5 %
Eosinophils Absolute: 0.3 10*3/uL (ref 0.0–0.7)
HCT: 49.2 % (ref 39.0–52.0)
Hemoglobin: 17 g/dL (ref 13.0–17.0)
LYMPHS ABS: 1.8 10*3/uL (ref 0.7–4.0)
LYMPHS PCT: 26 %
MCH: 31.6 pg (ref 26.0–34.0)
MCHC: 34.6 g/dL (ref 30.0–36.0)
MCV: 91.4 fL (ref 78.0–100.0)
MONO ABS: 0.6 10*3/uL (ref 0.1–1.0)
Monocytes Relative: 9 %
NEUTROS ABS: 4.3 10*3/uL (ref 1.7–7.7)
Neutrophils Relative %: 61 %
Platelets: 188 10*3/uL (ref 150–400)
RBC: 5.38 MIL/uL (ref 4.22–5.81)
RDW: 13 % (ref 11.5–15.5)
WBC: 7.2 10*3/uL (ref 4.0–10.5)

## 2017-06-02 LAB — TYPE AND SCREEN
ABO/RH(D): O NEG
Antibody Screen: NEGATIVE

## 2017-06-02 LAB — PROTIME-INR
INR: 1.03
PROTHROMBIN TIME: 13.4 s (ref 11.4–15.2)

## 2017-06-02 LAB — SURGICAL PCR SCREEN
MRSA, PCR: NEGATIVE
Staphylococcus aureus: POSITIVE — AB

## 2017-06-02 LAB — APTT: APTT: 29 s (ref 24–36)

## 2017-06-02 LAB — ABO/RH: ABO/RH(D): O NEG

## 2017-06-02 NOTE — Progress Notes (Signed)
Pt positive for Staph from nasal pcr screen. Prescription called to [facility], pt called and notified, verbalized understanding to begin treatment.

## 2017-06-02 NOTE — Progress Notes (Signed)
PCP: Dr. Ouida Sills, [facility] clinic Cardiologist: denies  EKG: today Denies ECHO, Stress Test, Cardiac Cath  Patient denies shortness of breath, fever, cough, and chest pain at PAT appointment.  Patient verbalized understanding of instructions provided today at the PAT appointment.  Patient asked to review instructions at home and day of surgery.

## 2017-06-03 LAB — URINE CULTURE: Culture: NO GROWTH

## 2017-06-05 ENCOUNTER — Encounter (HOSPITAL_COMMUNITY): Payer: Self-pay | Admitting: Physician Assistant

## 2017-06-05 DIAGNOSIS — M1711 Unilateral primary osteoarthritis, right knee: Secondary | ICD-10-CM | POA: Diagnosis present

## 2017-06-05 HISTORY — DX: Unilateral primary osteoarthritis, right knee: M17.11

## 2017-06-05 NOTE — H&P (Signed)
TOTAL KNEE ADMISSION H&P  Patient is being admitted for right total knee arthroplasty.  Subjective:  Chief Complaint:right knee pain.  HPI: Patrick Peterson, 52 y.o. male, has a history of pain and functional disability in the right knee due to arthritis and has failed non-surgical conservative treatments for greater than 12 weeks to includeNSAID's and/or analgesics, corticosteriod injections, viscosupplementation injections, flexibility and strengthening excercises, supervised PT with diminished ADL's post treatment and activity modification.  Onset of symptoms was gradual, starting 10 years ago with gradually worsening course since that time. The patient noted prior procedures on the knee to include  arthroscopy and menisectomy on the right knee(s).  Patient currently rates pain in the right knee(s) at 10 out of 10 with activity. Patient has night pain, worsening of pain with activity and weight bearing, pain that interferes with activities of daily living, crepitus and joint swelling.  Patient has evidence of subchondral sclerosis, periarticular osteophytes and joint space narrowing by imaging studies.  There is no active infection.  Patient Active Problem List   Diagnosis Date Noted  . Hyperbilirubinemia   . Transaminitis 12/30/2016  . GOA (generalized osteoarthritis) 02/25/2016  . Pure hypercholesterolemia 04/20/2014  . Steatohepatitis, non-alcoholic 16/03/9603   Past Medical History:  Diagnosis Date  . Arthritis   . GERD (gastroesophageal reflux disease)   . History of kidney stones     Past Surgical History:  Procedure Laterality Date  . APPENDECTOMY  2003  . CHOLECYSTECTOMY N/A 01/02/2017   Procedure: LAPAROSCOPIC CHOLECYSTECTOMY WITH INTRAOPERATIVE CHOLANGIOGRAM;  Surgeon: Olean Ree, MD;  Location: [facility] ORS;  Service: General;  Laterality: N/A;  . FINGER SURGERY Right 2015    No current facility-administered medications for this encounter.    No current outpatient  medications on file.   Allergies  Allergen Reactions  . Amoxicillin Rash and Other (See Comments)    Sharp abdominal pain, upset stomach Has patient had a PCN reaction causing immediate rash, facial/tongue/throat swelling, SOB or lightheadedness with hypotension: No Has patient had a PCN reaction causing severe rash involving mucus membranes or skin necrosis: No Has patient had a PCN reaction that required hospitalization: No Has patient had a PCN reaction occurring within the last 10 years: Yes If all of the above answers are "NO", then may proceed with Cephalosporin use.     Social History   Tobacco Use  . Smoking status: Former Smoker    Types: Cigarettes    Last attempt to quit: 11/28/2016    Years since quitting: 0.5  . Smokeless tobacco: Former Network engineer Use Topics  . Alcohol use: Yes    Comment: social    No family history on file.   ROS  Objective:  Physical Exam  Vital signs in last 24 hours:    Labs:   Estimated body mass index is 39.57 kg/m as calculated from the following:   Height as of 06/02/17: 6' 2.5" (1.892 m).   Weight as of 06/02/17: 141.7 kg (312 lb 6.4 oz).   Imaging Review Plain radiographs demonstrate severe degenerative joint disease of the right knee(s). The overall alignment issignificant varus. The bone quality appears to be good for age and reported activity level.  Assessment/Plan:  End stage arthritis, right knee  Principal Problem:   Primary osteoarthritis of right knee Active Problems:   Transaminitis   Hyperbilirubinemia   Steatohepatitis, non-alcoholic   The patient history, physical examination, clinical judgment of the provider and imaging studies are consistent with end stage degenerative joint disease  of the right knee(s) and total knee arthroplasty is deemed medically necessary. The treatment options including medical management, injection therapy arthroscopy and arthroplasty were discussed at length. The risks and  benefits of total knee arthroplasty were presented and reviewed. The risks due to aseptic loosening, infection, stiffness, patella tracking problems, thromboembolic complications and other imponderables were discussed. The patient acknowledged the explanation, agreed to proceed with the plan and consent was signed. Patient is being admitted for inpatient treatment for surgery, pain control, PT, OT, prophylactic antibiotics, VTE prophylaxis, progressive ambulation and ADL's and discharge planning. The patient is planning to be discharged home with home health services

## 2017-06-10 MED ORDER — TRANEXAMIC ACID 1000 MG/10ML IV SOLN
1000.0000 mg | INTRAVENOUS | Status: AC
  Administered 2017-06-13: 1000 mg via INTRAVENOUS
  Filled 2017-06-10: qty 10
  Filled 2017-06-10: qty 1100

## 2017-06-10 MED ORDER — SODIUM CHLORIDE 0.9 % IV SOLN
1500.0000 mg | INTRAVENOUS | Status: AC
  Administered 2017-06-13: 1500 mg via INTRAVENOUS
  Filled 2017-06-10: qty 1500

## 2017-06-12 NOTE — Anesthesia Preprocedure Evaluation (Addendum)
Anesthesia Evaluation  Patient identified by MRN, date of birth, ID band Patient awake    Reviewed: Allergy & Precautions, NPO status , Patient's Chart, lab work & pertinent test results  Airway Mallampati: III  TM Distance: >3 FB Neck ROM: Full    Dental  (+) Dental Advisory Given   Pulmonary former smoker,    breath sounds clear to auscultation       Cardiovascular negative cardio ROS   Rhythm:Regular Rate:Normal     Neuro/Psych negative neurological ROS     GI/Hepatic GERD  ,(+) Hepatitis -  Endo/Other  negative endocrine ROSMorbid obesity  Renal/GU negative Renal ROS     Musculoskeletal  (+) Arthritis ,   Abdominal   Peds  Hematology   Anesthesia Other Findings   Reproductive/Obstetrics                            Lab Results  Component Value Date   WBC 7.2 06/02/2017   HGB 17.0 06/02/2017   HCT 49.2 06/02/2017   MCV 91.4 06/02/2017   PLT 188 06/02/2017   Lab Results  Component Value Date   INR 1.03 06/02/2017   INR 1.01 12/31/2016   INR 1.02 12/30/2016   Lab Results  Component Value Date   CREATININE 1.11 06/02/2017   BUN 10 06/02/2017   NA 140 06/02/2017   K 3.9 06/02/2017   CL 105 06/02/2017   CO2 28 06/02/2017    Anesthesia Physical Anesthesia Plan  ASA: II  Anesthesia Plan: Spinal   Post-op Pain Management:  Regional for Post-op pain   Induction:   PONV Risk Score and Plan: 2 and Ondansetron, Propofol infusion and Treatment may vary due to age or medical condition  Airway Management Planned: Natural Airway and Simple Face Mask  Additional Equipment:   Intra-op Plan:   Post-operative Plan:   Informed Consent: I have reviewed the patients History and Physical, chart, labs and discussed the procedure including the risks, benefits and alternatives for the proposed anesthesia with the patient or authorized representative who has indicated his/her  understanding and acceptance.     Plan Discussed with: CRNA  Anesthesia Plan Comments:        Anesthesia Quick Evaluation

## 2017-06-13 ENCOUNTER — Inpatient Hospital Stay (HOSPITAL_COMMUNITY): Payer: 59 | Admitting: Anesthesiology

## 2017-06-13 ENCOUNTER — Encounter (HOSPITAL_COMMUNITY): Payer: Self-pay | Admitting: *Deleted

## 2017-06-13 ENCOUNTER — Encounter (HOSPITAL_COMMUNITY)
Admission: RE | Disposition: A | Discharge status: Home Health | Payer: Self-pay | Source: Ambulatory Visit | Attending: Orthopedic Surgery

## 2017-06-13 ENCOUNTER — Observation Stay (HOSPITAL_COMMUNITY)
Admission: RE | Admit: 2017-06-13 | Discharge: 2017-06-14 | Disposition: A | Discharge status: Home Health | Payer: 59 | Source: Ambulatory Visit | Attending: Orthopedic Surgery | Admitting: Orthopedic Surgery

## 2017-06-13 DIAGNOSIS — Z87442 Personal history of urinary calculi: Secondary | ICD-10-CM | POA: Insufficient documentation

## 2017-06-13 DIAGNOSIS — Z7982 Long term (current) use of aspirin: Secondary | ICD-10-CM | POA: Diagnosis not present

## 2017-06-13 DIAGNOSIS — K7581 Nonalcoholic steatohepatitis (NASH): Secondary | ICD-10-CM | POA: Diagnosis not present

## 2017-06-13 DIAGNOSIS — R740 Nonspecific elevation of levels of transaminase and lactic acid dehydrogenase [LDH]: Secondary | ICD-10-CM | POA: Diagnosis not present

## 2017-06-13 DIAGNOSIS — M1711 Unilateral primary osteoarthritis, right knee: Secondary | ICD-10-CM | POA: Diagnosis not present

## 2017-06-13 DIAGNOSIS — Z79899 Other long term (current) drug therapy: Secondary | ICD-10-CM | POA: Insufficient documentation

## 2017-06-13 DIAGNOSIS — Z880 Allergy status to penicillin: Secondary | ICD-10-CM | POA: Insufficient documentation

## 2017-06-13 DIAGNOSIS — G8918 Other acute postprocedural pain: Secondary | ICD-10-CM | POA: Diagnosis not present

## 2017-06-13 DIAGNOSIS — M25761 Osteophyte, right knee: Secondary | ICD-10-CM | POA: Insufficient documentation

## 2017-06-13 DIAGNOSIS — M21161 Varus deformity, not elsewhere classified, right knee: Secondary | ICD-10-CM | POA: Diagnosis not present

## 2017-06-13 DIAGNOSIS — K219 Gastro-esophageal reflux disease without esophagitis: Secondary | ICD-10-CM | POA: Insufficient documentation

## 2017-06-13 DIAGNOSIS — Z87891 Personal history of nicotine dependence: Secondary | ICD-10-CM | POA: Insufficient documentation

## 2017-06-13 DIAGNOSIS — E7800 Pure hypercholesterolemia, unspecified: Secondary | ICD-10-CM | POA: Diagnosis not present

## 2017-06-13 DIAGNOSIS — R7401 Elevation of levels of liver transaminase levels: Secondary | ICD-10-CM | POA: Diagnosis present

## 2017-06-13 HISTORY — DX: Unilateral primary osteoarthritis, right knee: M17.11

## 2017-06-13 HISTORY — PX: TOTAL KNEE ARTHROPLASTY: SHX125

## 2017-06-13 SURGERY — ARTHROPLASTY, KNEE, TOTAL
Anesthesia: Spinal | Laterality: Right

## 2017-06-13 MED ORDER — METOCLOPRAMIDE HCL 5 MG PO TABS: 5.0000 mg | ORAL_TABLET | Freq: Three times a day (TID) | ORAL | Status: DC | PRN

## 2017-06-13 MED ORDER — DEXAMETHASONE SODIUM PHOSPHATE 10 MG/ML IJ SOLN
10.0000 mg | Freq: Three times a day (TID) | INTRAMUSCULAR | Status: DC
  Administered 2017-06-13 – 2017-06-14 (×3): 10 mg via INTRAVENOUS
  Filled 2017-06-13 (×3): qty 1

## 2017-06-13 MED ORDER — MIDAZOLAM HCL 5 MG/5ML IJ SOLN
INTRAMUSCULAR | Status: DC | PRN
  Administered 2017-06-13: 2 mg via INTRAVENOUS

## 2017-06-13 MED ORDER — POVIDONE-IODINE 7.5 % EX SOLN
Freq: Once | CUTANEOUS | Status: DC
  Filled 2017-06-13: qty 118

## 2017-06-13 MED ORDER — BUPIVACAINE-EPINEPHRINE 0.25% -1:200000 IJ SOLN
INTRAMUSCULAR | Status: DC | PRN
  Administered 2017-06-13: 30 mL

## 2017-06-13 MED ORDER — MIDAZOLAM HCL 2 MG/2ML IJ SOLN
INTRAMUSCULAR | Status: AC
  Filled 2017-06-13: qty 2

## 2017-06-13 MED ORDER — PROPOFOL 10 MG/ML IV BOLUS
INTRAVENOUS | Status: AC
  Filled 2017-06-13: qty 20

## 2017-06-13 MED ORDER — ROPIVACAINE HCL 7.5 MG/ML IJ SOLN
INTRAMUSCULAR | Status: DC | PRN
  Administered 2017-06-13: 20 mL via PERINEURAL

## 2017-06-13 MED ORDER — SODIUM CHLORIDE 0.9 % IV BOLUS (SEPSIS): 500.0000 mL | Freq: Once | INTRAVENOUS | Status: DC

## 2017-06-13 MED ORDER — HYDROMORPHONE HCL 1 MG/ML IJ SOLN
1.0000 mg | INTRAMUSCULAR | Status: DC | PRN
  Administered 2017-06-14: 1 mg via INTRAVENOUS
  Filled 2017-06-13: qty 1

## 2017-06-13 MED ORDER — POLYETHYLENE GLYCOL 3350 17 G PO PACK: 17.0000 g | PACK | Freq: Two times a day (BID) | ORAL | Status: DC

## 2017-06-13 MED ORDER — DEXTROSE 5 % IV SOLN
INTRAVENOUS | Status: AC | PRN
  Administered 2017-06-13: 1.5 g via INTRAVENOUS

## 2017-06-13 MED ORDER — ACETAMINOPHEN 325 MG PO TABS: 650.0000 mg | ORAL_TABLET | ORAL | Status: DC | PRN

## 2017-06-13 MED ORDER — EPINEPHRINE PF 1 MG/ML IJ SOLN
INTRAMUSCULAR | Status: AC
  Filled 2017-06-13: qty 1

## 2017-06-13 MED ORDER — VANCOMYCIN HCL IN DEXTROSE 1-5 GM/200ML-% IV SOLN
1000.0000 mg | Freq: Two times a day (BID) | INTRAVENOUS | Status: AC
  Administered 2017-06-13: 1000 mg via INTRAVENOUS
  Filled 2017-06-13: qty 200

## 2017-06-13 MED ORDER — ONDANSETRON HCL 4 MG/2ML IJ SOLN: 4.0000 mg | Freq: Once | INTRAMUSCULAR | Status: DC | PRN

## 2017-06-13 MED ORDER — CHLORHEXIDINE GLUCONATE 4 % EX LIQD: 60.0000 mL | Freq: Once | CUTANEOUS | Status: DC

## 2017-06-13 MED ORDER — ASPIRIN EC 325 MG PO TBEC
325.0000 mg | DELAYED_RELEASE_TABLET | Freq: Every day | ORAL | Status: DC
  Administered 2017-06-14: 325 mg via ORAL
  Filled 2017-06-13: qty 1

## 2017-06-13 MED ORDER — DIPHENHYDRAMINE HCL 12.5 MG/5ML PO ELIX: 12.5000 mg | ORAL_SOLUTION | ORAL | Status: DC | PRN

## 2017-06-13 MED ORDER — DEXAMETHASONE SODIUM PHOSPHATE 10 MG/ML IJ SOLN
INTRAMUSCULAR | Status: AC
  Filled 2017-06-13: qty 1

## 2017-06-13 MED ORDER — MENTHOL 3 MG MT LOZG: 1.0000 | LOZENGE | OROMUCOSAL | Status: DC | PRN

## 2017-06-13 MED ORDER — FENTANYL CITRATE (PF) 250 MCG/5ML IJ SOLN
INTRAMUSCULAR | Status: AC
  Filled 2017-06-13: qty 5

## 2017-06-13 MED ORDER — PROPOFOL 500 MG/50ML IV EMUL
INTRAVENOUS | Status: DC | PRN
  Administered 2017-06-13: 50 ug/kg/min via INTRAVENOUS

## 2017-06-13 MED ORDER — POTASSIUM CHLORIDE IN NACL 20-0.9 MEQ/L-% IV SOLN
INTRAVENOUS | Status: DC
  Administered 2017-06-13: 11:00:00 via INTRAVENOUS
  Filled 2017-06-13 (×2): qty 1000

## 2017-06-13 MED ORDER — OXYCODONE HCL 5 MG PO TABS
5.0000 mg | ORAL_TABLET | ORAL | Status: DC | PRN
  Administered 2017-06-13 (×2): 5 mg via ORAL
  Filled 2017-06-13 (×2): qty 1

## 2017-06-13 MED ORDER — BUPIVACAINE HCL (PF) 0.25 % IJ SOLN
INTRAMUSCULAR | Status: AC
  Filled 2017-06-13: qty 30

## 2017-06-13 MED ORDER — ALUM & MAG HYDROXIDE-SIMETH 200-200-20 MG/5ML PO SUSP: 30.0000 mL | ORAL | Status: DC | PRN

## 2017-06-13 MED ORDER — DEXAMETHASONE SODIUM PHOSPHATE 4 MG/ML IJ SOLN
INTRAMUSCULAR | Status: DC | PRN
  Administered 2017-06-13: 10 mg via INTRAVENOUS

## 2017-06-13 MED ORDER — ACETAMINOPHEN 500 MG PO TABS
1000.0000 mg | ORAL_TABLET | Freq: Four times a day (QID) | ORAL | Status: AC
  Administered 2017-06-13 – 2017-06-14 (×4): 1000 mg via ORAL
  Filled 2017-06-13 (×4): qty 2

## 2017-06-13 MED ORDER — PHENOL 1.4 % MT LIQD: 1.0000 | OROMUCOSAL | Status: DC | PRN

## 2017-06-13 MED ORDER — OXYCODONE HCL 5 MG PO TABS
10.0000 mg | ORAL_TABLET | ORAL | Status: DC | PRN
  Administered 2017-06-14 (×3): 10 mg via ORAL
  Filled 2017-06-13 (×3): qty 2

## 2017-06-13 MED ORDER — ONDANSETRON HCL 4 MG/2ML IJ SOLN: 4.0000 mg | Freq: Four times a day (QID) | INTRAMUSCULAR | Status: DC | PRN

## 2017-06-13 MED ORDER — FENTANYL CITRATE (PF) 100 MCG/2ML IJ SOLN
INTRAMUSCULAR | Status: DC | PRN
  Administered 2017-06-13: 100 ug via INTRAVENOUS
  Administered 2017-06-13: 50 ug via INTRAVENOUS

## 2017-06-13 MED ORDER — SODIUM CHLORIDE 0.9 % IR SOLN
Status: DC | PRN
  Administered 2017-06-13: 3000 mL

## 2017-06-13 MED ORDER — CEFUROXIME SODIUM 1.5 G IV SOLR
INTRAVENOUS | Status: AC
  Filled 2017-06-13: qty 1.5

## 2017-06-13 MED ORDER — LIDOCAINE HCL (CARDIAC) 20 MG/ML IV SOLN
INTRAVENOUS | Status: DC | PRN
  Administered 2017-06-13: 50 mg via INTRAVENOUS

## 2017-06-13 MED ORDER — HYDROMORPHONE HCL 1 MG/ML IJ SOLN: 0.2500 mg | INTRAMUSCULAR | Status: DC | PRN

## 2017-06-13 MED ORDER — BUPIVACAINE IN DEXTROSE 0.75-8.25 % IT SOLN
INTRATHECAL | Status: DC | PRN
  Administered 2017-06-13: 2 mL via INTRATHECAL

## 2017-06-13 MED ORDER — ACETAMINOPHEN 650 MG RE SUPP: 650.0000 mg | RECTAL | Status: DC | PRN

## 2017-06-13 MED ORDER — LACTATED RINGERS IV SOLN
INTRAVENOUS | Status: DC
  Administered 2017-06-13 (×2): via INTRAVENOUS

## 2017-06-13 MED ORDER — METOCLOPRAMIDE HCL 5 MG/ML IJ SOLN: 5.0000 mg | Freq: Three times a day (TID) | INTRAMUSCULAR | Status: DC | PRN

## 2017-06-13 MED ORDER — DOCUSATE SODIUM 100 MG PO CAPS: 100.0000 mg | ORAL_CAPSULE | Freq: Two times a day (BID) | ORAL | Status: DC

## 2017-06-13 MED ORDER — ONDANSETRON HCL 4 MG PO TABS
4.0000 mg | ORAL_TABLET | Freq: Four times a day (QID) | ORAL | Status: DC | PRN
Start: 2017-06-13 — End: 2017-06-14

## 2017-06-13 SURGICAL SUPPLY — 77 items
APL SKNCLS STERI-STRIP NONHPOA (GAUZE/BANDAGES/DRESSINGS) ×1
BANDAGE ESMARK 6X9 LF (GAUZE/BANDAGES/DRESSINGS) ×1 IMPLANT
BENZOIN TINCTURE PRP APPL 2/3 (GAUZE/BANDAGES/DRESSINGS) ×3 IMPLANT
BLADE SAGITTAL 25.0X1.19X90 (BLADE) ×2 IMPLANT
BLADE SAGITTAL 25.0X1.19X90MM (BLADE) ×1
BLADE SAW SGTL 13X75X1.27 (BLADE) ×3 IMPLANT
BLADE SURG 10 STRL SS (BLADE) ×6 IMPLANT
BNDG CMPR 9X6 STRL LF SNTH (GAUZE/BANDAGES/DRESSINGS) ×1
BNDG CMPR MED 15X6 ELC VLCR LF (GAUZE/BANDAGES/DRESSINGS) ×1
BNDG ELASTIC 6X15 VLCR STRL LF (GAUZE/BANDAGES/DRESSINGS) ×3 IMPLANT
BNDG ESMARK 6X9 LF (GAUZE/BANDAGES/DRESSINGS) ×3
BOWL SMART MIX CTS (DISPOSABLE) ×3 IMPLANT
CAPT KNEE TOTAL 3 ATTUNE ×2 IMPLANT
CEMENT HV SMART SET (Cement) ×6 IMPLANT
CLOSURE STERI-STRIP 1/2X4 (GAUZE/BANDAGES/DRESSINGS) ×1
CLOSURE WOUND 1/2 X4 (GAUZE/BANDAGES/DRESSINGS) ×1
CLSR STERI-STRIP ANTIMIC 1/2X4 (GAUZE/BANDAGES/DRESSINGS) ×1 IMPLANT
COVER SURGICAL LIGHT HANDLE (MISCELLANEOUS) ×3 IMPLANT
CUFF TOURNIQUET SINGLE 34IN LL (TOURNIQUET CUFF) ×3 IMPLANT
CUFF TOURNIQUET SINGLE 44IN (TOURNIQUET CUFF) IMPLANT
DECANTER SPIKE VIAL GLASS SM (MISCELLANEOUS) ×3 IMPLANT
DRAPE EXTREMITY T 121X128X90 (DRAPE) ×3 IMPLANT
DRAPE HALF SHEET 40X57 (DRAPES) ×6 IMPLANT
DRAPE INCISE IOBAN 66X45 STRL (DRAPES) IMPLANT
DRAPE ORTHO SPLIT 77X108 STRL (DRAPES) ×3
DRAPE SURG ORHT 6 SPLT 77X108 (DRAPES) ×1 IMPLANT
DRAPE U-SHAPE 47X51 STRL (DRAPES) ×3 IMPLANT
DRSG AQUACEL AG ADV 3.5X14 (GAUZE/BANDAGES/DRESSINGS) ×3 IMPLANT
DURAPREP 26ML APPLICATOR (WOUND CARE) ×6 IMPLANT
ELECT CAUTERY BLADE 6.4 (BLADE) ×3 IMPLANT
ELECT REM PT RETURN 9FT ADLT (ELECTROSURGICAL) ×3
ELECTRODE REM PT RTRN 9FT ADLT (ELECTROSURGICAL) ×1 IMPLANT
FACESHIELD WRAPAROUND (MASK) ×3 IMPLANT
FACESHIELD WRAPAROUND OR TEAM (MASK) ×1 IMPLANT
GLOVE BIO SURGEON STRL SZ7 (GLOVE) ×3 IMPLANT
GLOVE BIOGEL PI IND STRL 7.0 (GLOVE) ×1 IMPLANT
GLOVE BIOGEL PI IND STRL 7.5 (GLOVE) ×1 IMPLANT
GLOVE BIOGEL PI INDICATOR 7.0 (GLOVE) ×2
GLOVE BIOGEL PI INDICATOR 7.5 (GLOVE) ×2
GLOVE SS BIOGEL STRL SZ 7.5 (GLOVE) ×1 IMPLANT
GLOVE SUPERSENSE BIOGEL SZ 7.5 (GLOVE) ×2
GOWN STRL REUS W/ TWL LRG LVL3 (GOWN DISPOSABLE) ×1 IMPLANT
GOWN STRL REUS W/ TWL XL LVL3 (GOWN DISPOSABLE) ×2 IMPLANT
GOWN STRL REUS W/TWL LRG LVL3 (GOWN DISPOSABLE) ×3
GOWN STRL REUS W/TWL XL LVL3 (GOWN DISPOSABLE) ×6
HANDPIECE INTERPULSE COAX TIP (DISPOSABLE) ×3
HOOD PEEL AWAY FACE SHEILD DIS (HOOD) ×6 IMPLANT
IMMOBILIZER KNEE 22 UNIV (SOFTGOODS) ×3 IMPLANT
KIT BASIN OR (CUSTOM PROCEDURE TRAY) ×3 IMPLANT
KIT ROOM TURNOVER OR (KITS) ×3 IMPLANT
MANIFOLD NEPTUNE II (INSTRUMENTS) ×3 IMPLANT
MARKER SKIN DUAL TIP RULER LAB (MISCELLANEOUS) ×3 IMPLANT
NDL 18GX1X1/2 (RX/OR ONLY) (NEEDLE) ×1 IMPLANT
NEEDLE 18GX1X1/2 (RX/OR ONLY) (NEEDLE) ×3 IMPLANT
NS IRRIG 1000ML POUR BTL (IV SOLUTION) ×3 IMPLANT
PACK TOTAL JOINT (CUSTOM PROCEDURE TRAY) ×3 IMPLANT
PAD ARMBOARD 7.5X6 YLW CONV (MISCELLANEOUS) ×6 IMPLANT
SET HNDPC FAN SPRY TIP SCT (DISPOSABLE) ×1 IMPLANT
STRIP CLOSURE SKIN 1/2X4 (GAUZE/BANDAGES/DRESSINGS) ×2 IMPLANT
SUCTION FRAZIER HANDLE 10FR (MISCELLANEOUS) ×2
SUCTION TUBE FRAZIER 10FR DISP (MISCELLANEOUS) ×1 IMPLANT
SUT MNCRL AB 3-0 PS2 18 (SUTURE) ×3 IMPLANT
SUT VIC AB 0 CT1 27 (SUTURE) ×6
SUT VIC AB 0 CT1 27XBRD ANBCTR (SUTURE) ×2 IMPLANT
SUT VIC AB 1 CT1 27 (SUTURE) ×3
SUT VIC AB 1 CT1 27XBRD ANBCTR (SUTURE) ×1 IMPLANT
SUT VIC AB 2-0 CT1 27 (SUTURE) ×6
SUT VIC AB 2-0 CT1 TAPERPNT 27 (SUTURE) ×2 IMPLANT
SYR 30ML LL (SYRINGE) ×3 IMPLANT
TOWEL OR 17X24 6PK STRL BLUE (TOWEL DISPOSABLE) ×3 IMPLANT
TOWEL OR 17X26 10 PK STRL BLUE (TOWEL DISPOSABLE) ×3 IMPLANT
TRAY CATH 16FR W/PLASTIC CATH (SET/KITS/TRAYS/PACK) IMPLANT
TRAY FOLEY CATH SILVER 16FR (SET/KITS/TRAYS/PACK) ×3 IMPLANT
TUBE CONNECTING 12'X1/4 (SUCTIONS) ×1
TUBE CONNECTING 12X1/4 (SUCTIONS) ×2 IMPLANT
WATER STERILE IRR 1000ML POUR (IV SOLUTION) ×3 IMPLANT
YANKAUER SUCT BULB TIP NO VENT (SUCTIONS) ×3 IMPLANT

## 2017-06-13 NOTE — Transfer of Care (Signed)
Immediate Anesthesia Transfer of Care Note  Patient: Patrick Peterson  Procedure(s) Performed: TOTAL KNEE ARTHROPLASTY (Right )  Patient Location: PACU  Anesthesia Type:MAC and Spinal  Level of Consciousness: awake and alert   Airway & Oxygen Therapy: Patient Spontanous Breathing and Patient connected to face mask oxygen  Post-op Assessment: Report given to RN and Post -op Vital signs reviewed and stable  Post vital signs: Reviewed and stable  Last Vitals:  Vitals:   06/13/17 0546  BP: (!) 153/91  Pulse: 64  Resp: 18  Temp: 36.7 C  SpO2: 96%    Last Pain:  Vitals:   06/13/17 0546  TempSrc: Oral      Patients Stated Pain Goal: 3 (15/83/09 4076)  Complications: No apparent anesthesia complications

## 2017-06-13 NOTE — Anesthesia Procedure Notes (Signed)
Anesthesia Regional Block: Adductor canal block   Pre-Anesthetic Checklist: ,, timeout performed, Correct Patient, Correct Site, Correct Laterality, Correct Procedure, Correct Position, site marked, Risks and benefits discussed,  Surgical consent,  Pre-op evaluation,  At surgeon's request and post-op pain management  Laterality: Right  Prep: chloraprep       Needles:  Injection technique: Single-shot  Needle Type: Echogenic Needle     Needle Length: 9cm  Needle Gauge: 21     Additional Needles:   Procedures:,,,, ultrasound used (permanent image in chart),,,,  Narrative:  Start time: 06/13/2017 6:52 AM End time: 06/13/2017 6:57 AM Injection made incrementally with aspirations every 5 mL.  Performed by: Personally  Anesthesiologist: Suzette Battiest, MD

## 2017-06-13 NOTE — Anesthesia Procedure Notes (Addendum)
Spinal  Patient location during procedure: OR Start time: 06/13/2017 7:15 AM End time: 06/13/2017 7:20 AM Staffing Anesthesiologist: Suzette Battiest, MD Performed: anesthesiologist  Preanesthetic Checklist Completed: patient identified, site marked, surgical consent, pre-op evaluation, timeout performed, IV checked, risks and benefits discussed and monitors and equipment checked Spinal Block Patient position: sitting Prep: site prepped and draped and DuraPrep Patient monitoring: blood pressure, continuous pulse ox and heart rate Approach: midline Location: L4-5 Injection technique: single-shot Needle Needle type: Pencan  Needle gauge: 24 G Needle length: 9 cm Needle insertion depth: 8 cm

## 2017-06-13 NOTE — Evaluation (Signed)
Physical Therapy Evaluation Patient Details Name: Patrick Peterson MRN: [id number] DOB: Jan 08, 1966 Today's Date: 06/13/2017   History of Present Illness  Pt is a 52 y/o male s/p elective R TKA. PMH includes transaminitis, and nonalcoholic steatohepatitis.   Clinical Impression  Pt is s/p surgery above with deficits below. PTA, pt was independent with functional mobility. Upon eval, pt presenting with post op pain and weakness. Required min to min guard assist for mobility with RW. Reports wife and parents will be able to provide assist upon d/c home and will need DME below. Follow up recommendations per MD arrangements. Will continue to follow acutely to maximize functional mobility independence and safety.     Follow Up Recommendations DC plan and follow up therapy as arranged by surgeon;Supervision for mobility/OOB    Equipment Recommendations  Rolling walker with 5" wheels;3in1 (PT)    Recommendations for Other Services       Precautions / Restrictions Precautions Precautions: Knee Precaution Booklet Issued: Yes (comment) Precaution Comments: Reivewed supine ther ex.  Restrictions Weight Bearing Restrictions: Yes RLE Weight Bearing: Weight bearing as tolerated      Mobility  Bed Mobility Overal bed mobility: Needs Assistance Bed Mobility: Supine to Sit     Supine to sit: Supervision     General bed mobility comments: Supervision for safety. Verbal cues for technique   Transfers Overall transfer level: Needs assistance Equipment used: Rolling walker (2 wheeled) Transfers: Sit to/from Stand Sit to Stand: Min assist         General transfer comment: Min A for lift assist and steadying assist. Verbal cues for safe hand placement.   Ambulation/Gait Ambulation/Gait assistance: Min guard Ambulation Distance (Feet): 125 Feet Assistive device: Rolling walker (2 wheeled) Gait Pattern/deviations: Step-to pattern;Step-through pattern;Decreased step length -  left;Decreased step length - right;Decreased weight shift to right;Antalgic Gait velocity: Decreased  Gait velocity interpretation: Below normal speed for age/gender General Gait Details: Slow, antalgic gait. Verbal cues for sequencing using RW. Able to progress to step through pattern, however, continued to demonstrate decreased weightshift to the R.   Stairs            Wheelchair Mobility    Modified Rankin (Stroke Patients Only)       Balance Overall balance assessment: Needs assistance Sitting-balance support: No upper extremity supported;Feet supported Sitting balance-Leahy Scale: Good     Standing balance support: Bilateral upper extremity supported;During functional activity Standing balance-Leahy Scale: Poor Standing balance comment: Reliant on BUE support.                              Pertinent Vitals/Pain Pain Assessment: 0-10 Pain Score: 3  Pain Location: R knee  Pain Descriptors / Indicators: Burning;Operative site guarding Pain Intervention(s): Limited activity within patient's tolerance;Monitored during session;Repositioned    Home Living Family/patient expects to be discharged to:: Private residence Living Arrangements: Spouse/significant other Available Help at Discharge: Family;Available 24 hours/day Type of Home: House Home Access: Stairs to enter Entrance Stairs-Rails: Psychiatric nurse of Steps: 2 Home Layout: One level Home Equipment: None      Prior Function Level of Independence: Independent               Hand Dominance   Dominant Hand: Right    Extremity/Trunk Assessment   Upper Extremity Assessment Upper Extremity Assessment: Defer to OT evaluation    Lower Extremity Assessment Lower Extremity Assessment: RLE deficits/detail RLE Deficits / Details: Slightly decresaed sensation.  Able to perform ther ex below.. Deficits consistent with post op pain and weakness.     Cervical / Trunk  Assessment Cervical / Trunk Assessment: Normal  Communication   Communication: No difficulties  Cognition Arousal/Alertness: Awake/alert Behavior During Therapy: WFL for tasks assessed/performed Overall Cognitive Status: Within Functional Limits for tasks assessed                                        General Comments General comments (skin integrity, edema, etc.): Pt's wife present during session.     Exercises Total Joint Exercises Ankle Circles/Pumps: AROM;Both;Supine;20 reps Quad Sets: AROM;Right;10 reps Towel Squeeze: AROM;Both;10 reps Heel Slides: AROM;Right;10 reps(partial range )   Assessment/Plan    PT Assessment Patient needs continued PT services  PT Problem List Decreased strength;Decreased range of motion;Decreased balance;Decreased mobility;Decreased knowledge of use of DME;Pain;Impaired sensation       PT Treatment Interventions DME instruction;Gait training;Functional mobility training;Stair training;Therapeutic activities;Therapeutic exercise;Balance training;Neuromuscular re-education;Patient/family education    PT Goals (Current goals can be found in the Care Plan section)  Acute Rehab PT Goals Patient Stated Goal: to go home tomorrow  PT Goal Formulation: With patient Time For Goal Achievement: 06/20/17 Potential to Achieve Goals: Good    Frequency 7X/week   Barriers to discharge        Co-evaluation               AM-PAC PT "6 Clicks" Daily Activity  Outcome Measure Difficulty turning over in bed (including adjusting bedclothes, sheets and blankets)?: A Little Difficulty moving from lying on back to sitting on the side of the bed? : A Little Difficulty sitting down on and standing up from a chair with arms (e.g., wheelchair, bedside commode, etc,.)?: Unable Help needed moving to and from a bed to chair (including a wheelchair)?: A Little Help needed walking in hospital room?: A Little Help needed climbing 3-5 steps with a  railing? : A Little 6 Click Score: 16    End of Session Equipment Utilized During Treatment: Gait belt;Right knee immobilizer Activity Tolerance: Patient tolerated treatment well Patient left: in chair;with call bell/phone within reach;with family/visitor present Nurse Communication: Mobility status PT Visit Diagnosis: Other abnormalities of gait and mobility (R26.89);Pain Pain - Right/Left: Right Pain - part of body: Knee    Time: 1341-1414 PT Time Calculation (min) (ACUTE ONLY): 33 min   Charges:   PT Evaluation $PT Eval Low Complexity: 1 Low PT Treatments $Gait Training: 8-22 mins   PT G Codes:        Leighton Ruff, PT, DPT  Acute Rehabilitation Services  Pager: [phone]   Rudean Hitt 06/13/2017, 2:57 PM

## 2017-06-13 NOTE — Progress Notes (Signed)
Orthopedic Tech Progress Note Patient Details:  KREG EARHART [birth year] [id number]  CPM Right Knee CPM Right Knee: On Right Knee Flexion (Degrees): 90 Right Knee Extension (Degrees): 0 Additional Comments: trapeze bar patient helper  Post Interventions Patient Tolerated: Well Instructions Provided: Care of device  Hildred Priest 06/13/2017, 10:18 AM Viewed order from doctor's order list

## 2017-06-13 NOTE — Interval H&P Note (Signed)
History and Physical Interval Note:  06/13/2017 7:00 AM  Patrick Peterson  has presented today for surgery, with the diagnosis of OA RIGHT KNEE  The various methods of treatment have been discussed with the patient and family. After consideration of risks, benefits and other options for treatment, the patient has consented to  Procedure(s): TOTAL KNEE ARTHROPLASTY (Right) as a surgical intervention .  The patient's history has been reviewed, patient examined, no change in status, stable for surgery.  I have reviewed the patient's chart and labs.  Questions were answered to the patient's satisfaction.     [name]

## 2017-06-13 NOTE — Anesthesia Procedure Notes (Signed)
Procedure Name: MAC Date/Time: 06/13/2017 7:20 AM Performed by: Lieutenant Diego, CRNA Pre-anesthesia Checklist: Patient identified, Emergency Drugs available, Suction available, Patient being monitored and Timeout performed Patient Re-evaluated:Patient Re-evaluated prior to induction Oxygen Delivery Method: Simple face mask Preoxygenation: Pre-oxygenation with 100% oxygen Induction Type: IV induction

## 2017-06-13 NOTE — Plan of Care (Signed)
  Progressing Education: Knowledge of General Education information will improve 06/13/2017 1120 - Progressing by Harlin Heys, RN Health Behavior/Discharge Planning: Ability to manage health-related needs will improve 06/13/2017 1120 - Progressing by Harlin Heys, RN Clinical Measurements: Ability to maintain clinical measurements within normal limits will improve 06/13/2017 1120 - Progressing by Harlin Heys, RN Will remain free from infection 06/13/2017 1120 - Progressing by Harlin Heys, RN Diagnostic test results will improve 06/13/2017 1120 - Progressing by Harlin Heys, RN Respiratory complications will improve 06/13/2017 1120 - Progressing by Harlin Heys, RN Cardiovascular complication will be avoided 06/13/2017 1120 - Progressing by Harlin Heys, RN Activity: Risk for activity intolerance will decrease 06/13/2017 1120 - Progressing by Harlin Heys, RN Nutrition: Adequate nutrition will be maintained 06/13/2017 1120 - Progressing by Harlin Heys, RN Coping: Level of anxiety will decrease 06/13/2017 1120 - Progressing by Harlin Heys, RN Elimination: Will not experience complications related to bowel motility 06/13/2017 1120 - Progressing by Harlin Heys, RN Will not experience complications related to urinary retention 06/13/2017 1120 - Progressing by Harlin Heys, RN Pain Managment: General experience of comfort will improve 06/13/2017 1120 - Progressing by Harlin Heys, RN Safety: Ability to remain free from injury will improve 06/13/2017 1120 - Progressing by Harlin Heys, RN Skin Integrity: Risk for impaired skin integrity will decrease 06/13/2017 1120 - Progressing by Harlin Heys, RN

## 2017-06-13 NOTE — Op Note (Signed)
MRN:     [id number] DOB/AGE:    1965/11/18 / 52 y.o.       OPERATIVE REPORT    DATE OF PROCEDURE:  06/13/2017       PREOPERATIVE DIAGNOSIS:   PRIMARY LOCALIZED OA RIGHT KNEE      Estimated body mass index is 39.57 kg/m as calculated from the following:   Height as of 06/02/17: 6' 2.5" (1.892 m).   Weight as of 06/02/17: 141.7 kg (312 lb 6.4 oz).                                                        POSTOPERATIVE DIAGNOSIS:   SAME                                                                  PROCEDURE:  Procedure(s): TOTAL KNEE ARTHROPLASTY Using Depuy Attune RP implants #9 Femur, #9Tibia, 84mm  RP bearing, 38 Patella     SURGEON: [name]    ASSISTANT:  Kirstin Shepperson PA-C   (Present and scrubbed throughout the case, critical for assistance with exposure, retraction, instrumentation, and closure.)         ANESTHESIA: Spinal with Adductor Nerve Block     TOURNIQUET TIME: [id number]   COMPLICATIONS:  None     SPECIMENS: None   INDICATIONS FOR PROCEDURE: The patient has  OA RIGHT KNEE, varus deformities, XR shows bone on bone arthritis. Patient has failed all conservative measures including anti-inflammatory medicines, narcotics, attempts at  exercise and weight loss, cortisone injections and viscosupplementation.  Risks and benefits of surgery have been discussed, questions answered.   DESCRIPTION OF PROCEDURE: The patient identified by armband, received  right femoral nerve block and IV antibiotics, in the holding area at [facility]. Patient taken to the operating room, appropriate anesthetic  monitors were attached Spinal anesthesia induced with  the patient in supine position, Foley catheter was inserted. Tourniquet  applied high to the operative thigh. Lateral post and foot positioner  applied to the table, the lower extremity was then prepped and draped  in usual sterile fashion from the ankle to the tourniquet. Time-out procedure was performed. The limb was  wrapped with an Esmarch bandage and the tourniquet inflated to 365 mmHg. We began the operation by making the anterior midline incision starting at handbreadth above the patella going over the patella 1 cm medial to and  4 cm distal to the tibial tubercle. Small bleeders in the skin and the  subcutaneous tissue identified and cauterized. Transverse retinaculum was incised and reflected medially and a medial parapatellar arthrotomy was accomplished. the patella was everted and theprepatellar fat pad resected. The superficial medial collateral  ligament was then elevated from anterior to posterior along the proximal  flare of the tibia and anterior half of the menisci resected. The knee was hyperflexed exposing bone on bone arthritis. Peripheral and notch osteophytes as well as the cruciate ligaments were then resected. We continued to  work our way around posteriorly along the proximal tibia, and externally  rotated the tibia subluxing it out from underneath the  femur. A McHale  retractor was placed through the notch and a lateral Hohmann retractor  placed, and we then drilled through the proximal tibia in line with the  axis of the tibia followed by an intramedullary guide rod and 2-degree  posterior slope cutting guide. The tibial cutting guide was pinned into place  allowing resection of 4 mm of bone medially and about 6 mm of bone  laterally because of her varus deformity. Satisfied with the tibial resection, we then  entered the distal femur 2 mm anterior to the PCL origin with the  intramedullary guide rod and applied the distal femoral cutting guide  set at 85mm, with 5 degrees of valgus. This was pinned along the  epicondylar axis. At this point, the distal femoral cut was accomplished without difficulty. We then sized for a #9 femoral component and pinned the guide in 3 degrees of external rotation.The chamfer cutting guide was pinned into place. The anterior, posterior, and chamfer cuts were  accomplished without difficulty followed by  the  RP box cutting guide and the box cut. We also removed posterior osteophytes from the posterior femoral condyles. At this  time, the knee was brought into full extension. We checked our  extension and flexion gaps and found them symmetric at 97mm.  The patella thickness measured at 30 mm. We set the cutting guide at 20 and removed the posterior 9.5-10 mm  of the patella sized for 38 button and drilled the lollipop. The knee  was then once again hyperflexed exposing the proximal tibia. We sized for a #9 tibial base plate, applied the smokestack and the conical reamer followed by the the Delta fin keel punch. We then hammered into place the  RP trial femoral component, inserted a 1 trial bearing, trial patellar button, and took the knee through range of motion from 0-130 degrees. No thumb pressure was required for patellar  tracking. At this point, all trial components were removed, a double batch of DePuy HV cement with 1500 mg of Zinacef was mixed and applied to all bony metallic mating surfaces except for the posterior condyles of the femur itself. In order, we  hammered into place the tibial tray and removed excess cement, the femoral component and removed excess cement, a 56mm  RP bearing  was inserted, and the knee brought to full extension with compression.  The patellar button was clamped into place, and excess cement  removed. While the cement cured the wound was irrigated out with normal saline solution pulse lavage.. Ligament stability and patellar tracking were checked and found to be excellent.. The parapatellar arthrotomy was closed with  #1 Vicryl suture. The subcutaneous tissue with 0 and 2-0 undyed  Vicryl suture, and 4-0 Monocryl.. A dressing of Aquaseal,  4 x 4, dressing sponges, Webril, and Ace wrap applied. Needle and sponge count were correct times 2.The patient awakened, extubated, and taken to recovery room without difficulty. Vascular  status was normal, pulses 2+ and symmetric.   [name] 06/13/2017, 9:09 AM

## 2017-06-14 ENCOUNTER — Encounter (HOSPITAL_COMMUNITY): Payer: Self-pay | Admitting: Orthopedic Surgery

## 2017-06-14 DIAGNOSIS — M1711 Unilateral primary osteoarthritis, right knee: Secondary | ICD-10-CM | POA: Diagnosis not present

## 2017-06-14 DIAGNOSIS — Z96651 Presence of right artificial knee joint: Secondary | ICD-10-CM | POA: Diagnosis not present

## 2017-06-14 LAB — BASIC METABOLIC PANEL
ANION GAP: 10 (ref 5–15)
BUN: 12 mg/dL (ref 6–20)
CHLORIDE: 106 mmol/L (ref 101–111)
CO2: 21 mmol/L — ABNORMAL LOW (ref 22–32)
Calcium: 8.9 mg/dL (ref 8.9–10.3)
Creatinine, Ser: 1.19 mg/dL (ref 0.61–1.24)
GFR calc Af Amer: >60 mL/min (ref >60)
GLUCOSE: 178 mg/dL — AB (ref 65–99)
Potassium: 4.4 mmol/L (ref 3.5–5.1)
Sodium: 137 mmol/L (ref 135–145)

## 2017-06-14 LAB — CBC
HEMATOCRIT: 42.1 % (ref 39.0–52.0)
HEMOGLOBIN: 14.6 g/dL (ref 13.0–17.0)
MCH: 31.3 pg (ref 26.0–34.0)
MCHC: 34.7 g/dL (ref 30.0–36.0)
MCV: 90.1 fL (ref 78.0–100.0)
Platelets: 218 10*3/uL (ref 150–400)
RBC: 4.67 MIL/uL (ref 4.22–5.81)
RDW: 12.6 % (ref 11.5–15.5)
WBC: 17.2 10*3/uL — AB (ref 4.0–10.5)

## 2017-06-14 MED ORDER — ACETAMINOPHEN 325 MG PO TABS: 650.0000 mg | ORAL_TABLET | ORAL | Status: AC | PRN

## 2017-06-14 MED ORDER — OXYCODONE HCL 10 MG PO TABS: ORAL_TABLET | ORAL | 0 refills | Status: AC

## 2017-06-14 MED ORDER — ASPIRIN 325 MG PO TBEC: DELAYED_RELEASE_TABLET | ORAL | 0 refills | Status: AC

## 2017-06-14 MED ORDER — DOCUSATE SODIUM 100 MG PO CAPS: ORAL_CAPSULE | ORAL | 0 refills | Status: AC

## 2017-06-14 MED ORDER — POLYETHYLENE GLYCOL 3350 17 G PO PACK: PACK | ORAL | 0 refills | Status: AC

## 2017-06-14 NOTE — Plan of Care (Signed)
  Progressing Education: Knowledge of General Education information will improve 06/14/2017 1116 - Progressing by Harlin Heys, RN Knowledge of General Education information will improve 06/14/2017 1116 - Progressing by Harlin Heys, [place] Behavior/Discharge Planning: Ability to manage health-related needs will improve 06/14/2017 1116 - Progressing by Harlin Heys, RN Clinical Measurements: Ability to maintain clinical measurements within normal limits will improve 06/14/2017 1116 - Progressing by Harlin Heys, RN Will remain free from infection 06/14/2017 1116 - Progressing by Harlin Heys, RN Diagnostic test results will improve 06/14/2017 1116 - Progressing by Harlin Heys, RN Respiratory complications will improve 06/14/2017 1116 - Progressing by Harlin Heys, RN Cardiovascular complication will be avoided 06/14/2017 1116 - Progressing by Harlin Heys, RN Activity: Risk for activity intolerance will decrease 06/14/2017 1116 - Progressing by Harlin Heys, RN Nutrition: Adequate nutrition will be maintained 06/14/2017 1116 - Progressing by Harlin Heys, RN Coping: Level of anxiety will decrease 06/14/2017 1116 - Progressing by Harlin Heys, RN Elimination: Will not experience complications related to bowel motility 06/14/2017 1116 - Progressing by Harlin Heys, RN Will not experience complications related to urinary retention 06/14/2017 1116 - Progressing by Harlin Heys, RN Pain Managment: General experience of comfort will improve 06/14/2017 1116 - Progressing by Harlin Heys, RN Safety: Ability to remain free from injury will improve 06/14/2017 1116 - Progressing by Harlin Heys, RN Skin Integrity: Risk for impaired skin integrity will decrease 06/14/2017 1116 - Progressing by Harlin Heys, RN

## 2017-06-14 NOTE — Care Management Note (Signed)
Case Management Note  Patient Details  Name: PEACE JOST MRN: [id number] Date of Birth: 1965/12/01  Subjective/Objective:  52 yr old gentleman s/p right total knee arthroplasty.                  Action/Plan: Case manager spoke with patient concerning discharge plan and DME. Patient was preoperatively setup with [facility], no changes. He will have family support at discharge. DME to be delivered by [employer].   Expected Discharge Date:  06/14/17               Expected Discharge Plan:  [place]  In-House Referral:  NA  Discharge planning Services  CM Consult  Post Acute Care Choice:  Durable Medical Equipment, Home Health Choice offered to:  Patient  DME Arranged:  3-N-1, Walker rolling, CPM DME Agency:  [employer]  HH Arranged:  PT [place]:  [facility] [employer]  Status of Service:  Completed, signed off  If discussed at [employer] [facility] [employer], dates discussed:    Additional Comments:  Ninfa Meeker, RN 06/14/2017, 11:44 AM

## 2017-06-14 NOTE — Progress Notes (Signed)
Pt discharged home with wife. DME delivered to patient. [place] set up by CM. AVS and scripts given and reviewed with patient and wife at bedside. All questions answered. All belongings sent with patient. VSS. BP (!) 142/71 (BP Location: Right Arm)   Pulse 76   Temp 98.1 F (36.7 C) (Oral)   Resp 18   SpO2 100%

## 2017-06-14 NOTE — Progress Notes (Signed)
Physical Therapy Treatment Patient Details Name: Patrick Peterson MRN: [id number] DOB: 07/08/1965 Today's Date: 06/14/2017    History of Present Illness Pt is a 52 y/o male s/p elective R TKA. PMH includes transaminitis, and nonalcoholic steatohepatitis.     PT Comments    Patient is making good progress with PT.  From a mobility standpoint anticipate patient will be ready for DC home when medically ready.    Follow Up Recommendations  DC plan and follow up therapy as arranged by surgeon;Supervision for mobility/OOB     Equipment Recommendations  Rolling walker with 5" wheels;3in1 (PT)    Recommendations for Other Services       Precautions / Restrictions Precautions Precautions: Knee Precaution Booklet Issued: Yes (comment) Precaution Comments: precautions reviewed with pt Restrictions Weight Bearing Restrictions: Yes RLE Weight Bearing: Weight bearing as tolerated    Mobility  Bed Mobility               General bed mobility comments: pt OOB in chair upon arrival  Transfers Overall transfer level: Modified independent Equipment used: Rolling walker (2 wheeled) Transfers: Sit to/from Stand              Ambulation/Gait Ambulation/Gait assistance: Supervision Ambulation Distance (Feet): 200 Feet Assistive device: Rolling walker (2 wheeled) Gait Pattern/deviations: Step-through pattern;Decreased stride length;Trunk flexed Gait velocity: Decreased    General Gait Details: cues for posture; pt with little reliance on UE support and with improved gait  mechanics   Stairs Stairs: Yes   Stair Management: No rails;Step to pattern;Backwards;With walker Number of Stairs: (2 steps X2) General stair comments: cues for sequencing and technique; assist to stabilize RW; pt given stair handout   Wheelchair Mobility    Modified Rankin (Stroke Patients Only)       Balance Overall balance assessment: Needs assistance Sitting-balance support: No upper  extremity supported;Feet supported Sitting balance-Leahy Scale: Good     Standing balance support: No upper extremity supported Standing balance-Leahy Scale: Fair Standing balance comment: pt is able to static stand without UE support                            Cognition Arousal/Alertness: Awake/alert Behavior During Therapy: WFL for tasks assessed/performed Overall Cognitive Status: Within Functional Limits for tasks assessed                                        Exercises Total Joint Exercises Short Arc Quad: AROM;Right;10 reps Straight Leg Raises: AROM;Right;10 reps Long Arc Quad: AROM;Right;10 reps;Seated Knee Flexion: AROM;Right;10 reps;Seated;Other (comment)(10 sec holds) Goniometric ROM: 85 degrees flexion    General Comments        Pertinent Vitals/Pain Pain Assessment: Faces Faces Pain Scale: Hurts little more Pain Location: R knee  Pain Descriptors / Indicators: Guarding;Grimacing;Sore Pain Intervention(s): Monitored during session;Premedicated before session;Repositioned    Home Living                      Prior Function            PT Goals (current goals can now be found in the care plan section) Acute Rehab PT Goals PT Goal Formulation: With patient Time For Goal Achievement: 06/20/17 Potential to Achieve Goals: Good Progress towards PT goals: Progressing toward goals    Frequency    7X/week      PT  Plan Current plan remains appropriate    Co-evaluation              AM-PAC PT "6 Clicks" Daily Activity  Outcome Measure  Difficulty turning over in bed (including adjusting bedclothes, sheets and blankets)?: A Little Difficulty moving from lying on back to sitting on the side of the bed? : A Little Difficulty sitting down on and standing up from a chair with arms (e.g., wheelchair, bedside commode, etc,.)?: Unable Help needed moving to and from a bed to chair (including a wheelchair)?: None Help  needed walking in hospital room?: A Little Help needed climbing 3-5 steps with a railing? : A Little 6 Click Score: 17    End of Session Equipment Utilized During Treatment: Gait belt Activity Tolerance: Patient tolerated treatment well Patient left: in chair;with call bell/phone within reach Nurse Communication: Mobility status PT Visit Diagnosis: Other abnormalities of gait and mobility (R26.89);Pain Pain - Right/Left: Right Pain - part of body: Knee     Time: 4801-6553 PT Time Calculation (min) (ACUTE ONLY): 29 min  Charges:  $Gait Training: 8-22 mins $Therapeutic Exercise: 8-22 mins                    G Codes:       Earney Navy, PTA Pager: [phone]     Darliss Cheney 06/14/2017, 10:31 AM

## 2017-06-14 NOTE — Progress Notes (Signed)
Foley dc'd at 0530 by CNA. Pending void. Urinal at bedside.

## 2017-06-14 NOTE — Evaluation (Signed)
Occupational Therapy Evaluation Patient Details Name: Patrick Peterson MRN: [id number] DOB: 23-Oct-1965 Today's Date: 06/14/2017    History of Present Illness Pt is a 52 y/o male s/p elective R TKA. PMH includes transaminitis, and nonalcoholic steatohepatitis.    Clinical Impression   Patient evaluated by Occupational Therapy with no further acute OT needs identified. All education has been completed and the patient has no further questions. See below for any follow-up Occupational Therapy or equipment needs. OT to sign off. Thank you for referral.      Follow Up Recommendations  No OT follow up    Equipment Recommendations  None recommended by OT    Recommendations for Other Services       Precautions / Restrictions Precautions Precautions: Knee Precaution Comments: precautions for knee extension Restrictions Weight Bearing Restrictions: No RLE Weight Bearing: Weight bearing as tolerated      Mobility Bed Mobility               General bed mobility comments: in chair on arrival  Transfers Overall transfer level: Modified independent                    Balance                                           ADL either performed or assessed with clinical judgement   ADL Overall ADL's : Needs assistance/impaired Eating/Feeding: Modified independent   Grooming: Modified independent   Upper Body Bathing: Modified independent   Lower Body Bathing: Minimal assistance       Lower Body Dressing: Minimal assistance Lower Body Dressing Details (indicate cue type and reason): pt needed (A) to don compression socks and to place heel of shoe over. Pt otherwise about to complete LB adls Toilet Transfer: Modified Independent             General ADL Comments: pt able to complete basic transfers and adls.   Educated patient on knee full extension with return demonstration, educated tub/shower transfer,never to wash directly on incision site,  avoid water under bandage and benefits of wrapping dressing, sleeping positioning, avoid putting pillow under knee and  Car transfer.     Vision Baseline Vision/History: No visual deficits       Perception     Praxis      Pertinent Vitals/Pain Pain Assessment: Faces Faces Pain Scale: Hurts little more Pain Location: R knee quad area Pain Descriptors / Indicators: Operative site guarding Pain Intervention(s): Monitored during session;Premedicated before session     Hand Dominance Right   Extremity/Trunk Assessment Upper Extremity Assessment Upper Extremity Assessment: Overall WFL for tasks assessed   Lower Extremity Assessment Lower Extremity Assessment: Defer to PT evaluation   Cervical / Trunk Assessment Cervical / Trunk Assessment: Normal   Communication Communication Communication: No difficulties   Cognition Arousal/Alertness: Awake/alert Behavior During Therapy: WFL for tasks assessed/performed Overall Cognitive Status: Within Functional Limits for tasks assessed                                     General Comments  educated on bathing/ dressing and management of dressing    Exercises     Shoulder Instructions      Home Living Family/patient expects to be discharged to:: Private residence Living Arrangements: Spouse/significant  other Available Help at Discharge: Family;Available 24 hours/day Type of Home: House Home Access: Stairs to enter [employer] of Steps: 2 Entrance Stairs-Rails: Right;Left Home Layout: One level     Bathroom Shower/Tub: Occupational psychologist: Standard     Home Equipment: None          Prior Functioning/Environment Level of Independence: Independent                 OT Problem List:        OT Treatment/Interventions:      OT Goals(Current goals can be found in the care plan section) Acute Rehab OT Goals Patient Stated Goal: to go home  OT Frequency:     Barriers to  D/C:            Co-evaluation              AM-PAC PT "6 Clicks" Daily Activity     Outcome Measure Help from another person eating meals?: None Help from another person taking care of personal grooming?: None Help from another person toileting, which includes using toliet, bedpan, or urinal?: None Help from another person bathing (including washing, rinsing, drying)?: None Help from another person to put on and taking off regular upper body clothing?: None Help from another person to put on and taking off regular lower body clothing?: None 6 Click Score: 24   End of Session Equipment Utilized During Treatment: Rolling walker;Gait belt CPM Right Knee CPM Right Knee: Off Nurse Communication: Mobility status;Precautions  Activity Tolerance: Patient tolerated treatment well Patient left: in chair;with call bell/phone within reach                   Time: 1146-1207 OT Time Calculation (min): 21 min Charges:  OT General Charges $OT Visit: 1 Visit OT Evaluation $OT Eval Moderate Complexity: 1 Mod G-Codes:      Jeri Modena   OTR/L Pager: [phone] Office: [phone] .   Parke Poisson B 06/14/2017, 2:47 PM

## 2017-06-14 NOTE — Discharge Summary (Signed)
Patient ID: Patrick Peterson MRN: [id number] DOB/AGE: Aug 20, 1965 52 y.o.  Admit date: 06/13/2017 Discharge date: 06/14/2017  Admission Diagnoses:  Principal Problem:   Primary osteoarthritis of right knee Active Problems:   Transaminitis   Hyperbilirubinemia   Steatohepatitis, non-alcoholic   Primary localized osteoarthritis of right knee   Discharge Diagnoses:  Same  Past Medical History:  Diagnosis Date  . Arthritis   . GERD (gastroesophageal reflux disease)   . History of kidney stones   . Primary osteoarthritis of right knee 06/05/2017    Surgeries: Procedure(s): TOTAL KNEE ARTHROPLASTY on 06/13/2017   Consultants:   Discharged Condition: Improved  Hospital Course: Patrick Peterson is an 52 y.o. male who was admitted 06/13/2017 for operative treatment ofPrimary osteoarthritis of right knee. Patient has severe unremitting pain that affects sleep, daily activities, and work/hobbies. After pre-op clearance the patient was taken to the operating room on 06/13/2017 and underwent  Procedure(s): TOTAL KNEE ARTHROPLASTY.    Patient was given perioperative antibiotics:  Anti-infectives (From admission, onward)   Start     Dose/Rate Route Frequency Ordered Stop   06/13/17 1800  vancomycin (VANCOCIN) IVPB 1000 mg/200 mL premix     1,000 mg 200 mL/hr over 60 Minutes Intravenous Every 12 hours 06/13/17 1055 06/13/17 1900   06/13/17 0814  cefUROXime (ZINACEF) 1.5 g in dextrose 5 % 50 mL IVPB     over 30 Minutes  Continuous PRN 06/13/17 0816 06/13/17 0814   06/13/17 0700  vancomycin (VANCOCIN) 1,500 mg in sodium chloride 0.9 % 500 mL IVPB     1,500 mg 250 mL/hr over 120 Minutes Intravenous On call to O.R. 06/10/17 1326 06/13/17 1900       Patient was given sequential compression devices, early ambulation, and chemoprophylaxis to prevent DVT.  Patient benefited maximally from hospital stay and there were no complications.    Recent vital signs:  Patient Vitals for the past 24  hrs:  BP Temp Temp src Pulse Resp SpO2  06/14/17 0550 (!) 145/66 97.6 F (36.4 C) Oral 73 18 100 %  06/14/17 0056 129/69 98 F (36.7 C) Oral 76 17 100 %  06/13/17 2119 130/73 98.5 F (36.9 C) Oral 95 17 99 %  06/13/17 1500 (!) 154/94 97.8 F (36.6 C) Oral 66 16 99 %  06/13/17 1043 - (!) 97.2 F (36.2 C) - - - -  06/13/17 1041 (!) 132/97 - - 62 13 100 %  06/13/17 1030 - - - (!) 59 15 96 %  06/13/17 1026 (!) 139/93 - - 63 18 97 %  06/13/17 1015 - - - (!) 57 13 97 %  06/13/17 1011 125/82 - - 60 13 98 %  06/13/17 1000 - - - 63 14 97 %  06/13/17 0956 121/82 - - 62 16 98 %  06/13/17 0945 - - - 65 14 97 %  06/13/17 0942 - - - (!) 59 12 98 %  06/13/17 0941 115/81 (!) 97.3 F (36.3 C) - 67 - 97 %     Recent laboratory studies:  Recent Labs    06/14/17 0614  WBC 17.2*  HGB 14.6  HCT 42.1  PLT 218  NA 137  K 4.4  CL 106  CO2 21*  BUN 12  CREATININE 1.19  GLUCOSE 178*  CALCIUM 8.9     Discharge Medications:   Allergies as of 06/14/2017      Reactions   Amoxicillin Rash, Other (See Comments)   Sharp abdominal pain, upset stomach Has  patient had a PCN reaction causing immediate rash, facial/tongue/throat swelling, SOB or lightheadedness with hypotension: No Has patient had a PCN reaction causing severe rash involving mucus membranes or skin necrosis: No Has patient had a PCN reaction that required hospitalization: No Has patient had a PCN reaction occurring within the last 10 years: Yes If all of the above answers are "NO", then may proceed with Cephalosporin use.      Medication List    TAKE these medications   acetaminophen 325 MG tablet Commonly known as:  TYLENOL Take 2 tablets (650 mg total) by mouth every 4 (four) hours as needed for mild pain ((score 1 to 3) or temp > 100.5).   aspirin 325 MG EC tablet 1 tab a day for the next 30 days to prevent blood clots   docusate sodium 100 MG capsule Commonly known as:  COLACE 1 tab 2 times a day while on narcotics.   STOOL SOFTENER   Oxycodone HCl 10 MG Tabs 1 po q 4 hrs prn pain.  Right total knee replacement on 06/13/2017.   polyethylene glycol packet Commonly known as:  MIRALAX / GLYCOLAX 17grams in 6 oz of drink twice a day until bowel movement.  LAXITIVE.  Restart if two days since last bowel movement            Discharge Care Instructions  (From admission, onward)        Start     Ordered   06/14/17 0000  Change dressing    Comments:  DO NOT REMOVE BANDAGE OVER SURGICAL INCISION.  [place] WHOLE LEG INCLUDING OVER THE WATERPROOF BANDAGE WITH SOAP AND WATER EVERY DAY.   06/14/17 0845      Diagnostic Studies: No results found.  Disposition: 01-Home or Self Care  Discharge Instructions    CPM   Complete by:  As directed    Continuous passive motion machine (CPM):      Use the CPM from 0 to 90 for 6 hours per day.       You may break it up into 2 or 3 sessions per day.      Use CPM for 2 weeks or until you are told to stop.   Call MD / Call 911   Complete by:  As directed    If you experience chest pain or shortness of breath, CALL 911 and be transported to the hospital emergency room.  If you develope a fever above 101 F, pus (white drainage) or increased drainage or redness at the wound, or calf pain, call your surgeon's office.   Change dressing   Complete by:  As directed    DO NOT REMOVE BANDAGE OVER SURGICAL INCISION.  [place] WHOLE LEG INCLUDING OVER THE WATERPROOF BANDAGE WITH SOAP AND WATER EVERY DAY.   Constipation Prevention   Complete by:  As directed    Drink plenty of fluids.  Prune juice may be helpful.  You may use a stool softener, such as Colace (over the counter) 100 mg twice a day.  Use MiraLax (over the counter) for constipation as needed.   Diet - low sodium heart healthy   Complete by:  As directed    Discharge instructions   Complete by:  As directed    INSTRUCTIONS AFTER JOINT REPLACEMENT   Remove items at home which could result in a fall. This includes  throw rugs or furniture in walking pathways ICE to the affected joint every three hours while awake for 30 minutes at a  time, for at least the first 3-5 days, and then as needed for pain and swelling.  Continue to use ice for pain and swelling. You may notice swelling that will progress down to the foot and ankle.  This is normal after surgery.  Elevate your leg when you are not up walking on it.   Continue to use the breathing machine you got in the hospital (incentive spirometer) which will help keep your temperature down.  It is common for your temperature to cycle up and down following surgery, especially at night when you are not up moving around and exerting yourself.  The breathing machine keeps your lungs expanded and your temperature down.   DIET:  As you were doing prior to hospitalization, we recommend a well-balanced diet.  DRESSING / WOUND CARE / SHOWERING  Keep the surgical dressing until follow up.  The dressing is water proof, so you can shower without any extra covering.  IF THE DRESSING FALLS OFF or the wound gets wet inside, change the dressing with sterile gauze.  Please use good hand washing techniques before changing the dressing.  Do not use any lotions or creams on the incision until instructed by your surgeon.    ACTIVITY  Increase activity slowly as tolerated, but follow the weight bearing instructions below.   No driving for 6 weeks or until further direction given by your physician.  You cannot drive while taking narcotics.  No lifting or carrying greater than 10 lbs. until further directed by your surgeon. Avoid periods of inactivity such as sitting longer than an hour when not asleep. This helps prevent blood clots.  You may return to work once you are authorized by your doctor.     WEIGHT BEARING   Weight bearing as tolerated with assist device (walker, cane, etc) as directed, use it as long as suggested by your surgeon or therapist, typically at least 2-3  weeks.   EXERCISES  Results after joint replacement surgery are often greatly improved when you follow the exercise, range of motion and muscle strengthening exercises prescribed by your doctor. Safety measures are also important to protect the joint from further injury. Any time any of these exercises cause you to have increased pain or swelling, decrease what you are doing until you are comfortable again and then slowly increase them. If you have problems or questions, call your caregiver or physical therapist for advice.   Rehabilitation is important following a joint replacement. After just a few days of immobilization, the muscles of the leg can become weakened and shrink (atrophy).  These exercises are designed to build up the tone and strength of the thigh and leg muscles and to improve motion. Often times heat used for twenty to thirty minutes before working out will loosen up your tissues and help with improving the range of motion but do not use heat for the first two weeks following surgery (sometimes heat can increase post-operative swelling).   These exercises can be done on a training (exercise) mat, on the floor, on a table or on a bed. Use whatever works the best and is most comfortable for you.    Use music or television while you are exercising so that the exercises are a pleasant break in your day. This will make your life better with the exercises acting as a break in your routine that you can look forward to.   Perform all exercises about fifteen times, three times per day or as directed.  You  should exercise both the operative leg and the other leg as well.   Exercises include:  Quad Sets - Tighten up the muscle on the front of the thigh (Quad) and hold for 5-10 seconds.   Straight Leg Raises - With your knee straight (if you were given a brace, keep it on), lift the leg to 60 degrees, hold for 3 seconds, and slowly lower the leg.  Perform this exercise against resistance later as  your leg gets stronger.  Leg Slides: Lying on your back, slowly slide your foot toward your buttocks, bending your knee up off the floor (only go as far as is comfortable). Then slowly slide your foot back down until your leg is flat on the floor again.  Angel Wings: Lying on your back spread your legs to the side as far apart as you can without causing discomfort.  Hamstring Strength:  Lying on your back, push your heel against the floor with your leg straight by tightening up the muscles of your buttocks.  Repeat, but this time bend your knee to a comfortable angle, and push your heel against the floor.  You may put a pillow under the heel to make it more comfortable if necessary.   A rehabilitation program following joint replacement surgery can speed recovery and prevent re-injury in the future due to weakened muscles. Contact your doctor or a physical therapist for more information on knee rehabilitation.    CONSTIPATION  Constipation is defined medically as fewer than three stools per week and severe constipation as less than one stool per week.  Even if you have a regular bowel pattern at home, your normal regimen is likely to be disrupted due to multiple reasons following surgery.  Combination of anesthesia, postoperative narcotics, change in appetite and fluid intake all can affect your bowels.   YOU MUST use at least one of the following options; they are listed in order of increasing strength to get the job done.  They are all available over the counter, and you may need to use some, POSSIBLY even all of these options:    Drink plenty of fluids (prune juice may be helpful) and high fiber foods Colace 100 mg by mouth twice a day  Senokot for constipation as directed and as needed Dulcolax (bisacodyl), take with full glass of water  Miralax (polyethylene glycol) once or twice a day as needed.  If you have tried all these things and are unable to have a bowel movement in the first 3-4 days  after surgery call either your surgeon or your primary doctor.    If you experience loose stools or diarrhea, hold the medications until you stool forms back up.  If your symptoms do not get better within 1 week or if they get worse, check with your doctor.  If you experience "the worst abdominal pain ever" or develop nausea or vomiting, please contact the office immediately for further recommendations for treatment.   ITCHING:  If you experience itching with your medications, try taking only a single pain pill, or even half a pain pill at a time.  You can also use Benadryl over the counter for itching or also to help with sleep.   TED HOSE STOCKINGS:  Use stockings on both legs until for at least 2 weeks or as directed by physician office. They may be removed at night for sleeping.  MEDICATIONS:  See your medication summary on the "After Visit Summary" that nursing will review with you.  You may have some home medications which will be placed on hold until you complete the course of blood thinner medication.  It is important for you to complete the blood thinner medication as prescribed.  PRECAUTIONS:  If you experience chest pain or shortness of breath - call 911 immediately for transfer to the hospital emergency department.   If you develop a fever greater that 101 F, purulent drainage from wound, increased redness or drainage from wound, foul odor from the wound/dressing, or calf pain - CONTACT YOUR SURGEON.                                                   FOLLOW-UP APPOINTMENTS:  If you do not already have a post-op appointment, please call the office for an appointment to be seen by your surgeon.  Guidelines for how soon to be seen are listed in your "After Visit Summary", but are typically between 1-4 weeks after surgery.  OTHER INSTRUCTIONS:   Knee Replacement:  Do not place pillow under knee, focus on keeping the knee straight while resting. CPM instructions: 0-90 degrees, 2 hours in the  morning, 2 hours in the afternoon, and 2 hours in the evening. Place foam block, curve side up under heel at all times except when in CPM or when walking.  DO NOT modify, tear, cut, or change the foam block in any way.  MAKE SURE YOU:  Understand these instructions.  Get help right away if you are not doing well or get worse.    Thank you for letting us be a part of your medical care team.  It is a privilege we respect greatly.  We hope these instructions will help you stay on track for a fast and full recovery!   Do not put a pillow under the knee. Place it under the heel.   Complete by:  As directed    Place gray foam block, curve side up under heel at all times except when in CPM or when walking.  DO NOT modify, tear, cut, or change in any way the gray foam block.   Increase activity slowly as tolerated   Complete by:  As directed    Patient may shower   Complete by:  As directed    Aquacel dressing is water proof    Wash over it and the whole leg with soap and water at the end of your shower   TED hose   Complete by:  As directed    Use stockings (TED hose) for 2 weeks on both leg(s).  You may remove them at night for sleeping.      Follow-up Information    Elsie Saas, MD Follow up on 06/27/2017.   Specialty:  Orthopedic Surgery Why:  appointment time 4 pm with Dr Leotis Pain information: [place] [place] [address] [phone]        Specialists, [facility] Follow up on 06/28/2017.   Specialty:  Orthopedic Surgery Why:  9:30 am check in for a 10 am appointment with Ulice Dash at [facility] [place] in [place] in [employer] information: [place] [address] [phone]            Signed: Linda Hedges 06/14/2017, 8:48 AM

## 2017-06-16 DIAGNOSIS — K7581 Nonalcoholic steatohepatitis (NASH): Secondary | ICD-10-CM | POA: Diagnosis not present

## 2017-06-16 DIAGNOSIS — Z471 Aftercare following joint replacement surgery: Secondary | ICD-10-CM | POA: Diagnosis not present

## 2017-06-16 DIAGNOSIS — M150 Primary generalized (osteo)arthritis: Secondary | ICD-10-CM | POA: Diagnosis not present

## 2017-06-17 NOTE — Anesthesia Postprocedure Evaluation (Signed)
Anesthesia Post Note  Patient: Patrick Peterson  Procedure(s) Performed: TOTAL KNEE ARTHROPLASTY (Right )     Patient location during evaluation: PACU Anesthesia Type: Spinal Level of consciousness: awake, awake and alert and oriented Pain management: pain level controlled Vital Signs Assessment: post-procedure vital signs reviewed and stable Respiratory status: spontaneous breathing, nonlabored ventilation and respiratory function stable Cardiovascular status: blood pressure returned to baseline Postop Assessment: spinal receding Anesthetic complications: no    Last Vitals:  Vitals:   06/14/17 0550 06/14/17 1400  BP: (!) 145/66 (!) 142/71  Pulse: 73 76  Resp: 18 18  Temp: 36.4 C 36.7 C  SpO2: 100% 100%    Last Pain:  Vitals:   06/14/17 1400  TempSrc: Oral  PainSc:                  Kowen Kluth COKER

## 2017-06-18 DIAGNOSIS — Z471 Aftercare following joint replacement surgery: Secondary | ICD-10-CM | POA: Diagnosis not present

## 2017-06-18 DIAGNOSIS — M150 Primary generalized (osteo)arthritis: Secondary | ICD-10-CM | POA: Diagnosis not present

## 2017-06-18 DIAGNOSIS — K7581 Nonalcoholic steatohepatitis (NASH): Secondary | ICD-10-CM | POA: Diagnosis not present

## 2017-06-20 DIAGNOSIS — M150 Primary generalized (osteo)arthritis: Secondary | ICD-10-CM | POA: Diagnosis not present

## 2017-06-20 DIAGNOSIS — K7581 Nonalcoholic steatohepatitis (NASH): Secondary | ICD-10-CM | POA: Diagnosis not present

## 2017-06-20 DIAGNOSIS — Z471 Aftercare following joint replacement surgery: Secondary | ICD-10-CM | POA: Diagnosis not present

## 2017-06-22 DIAGNOSIS — Z471 Aftercare following joint replacement surgery: Secondary | ICD-10-CM | POA: Diagnosis not present

## 2017-06-22 DIAGNOSIS — M150 Primary generalized (osteo)arthritis: Secondary | ICD-10-CM | POA: Diagnosis not present

## 2017-06-22 DIAGNOSIS — K7581 Nonalcoholic steatohepatitis (NASH): Secondary | ICD-10-CM | POA: Diagnosis not present

## 2017-06-23 DIAGNOSIS — Z471 Aftercare following joint replacement surgery: Secondary | ICD-10-CM | POA: Diagnosis not present

## 2017-06-23 DIAGNOSIS — K7581 Nonalcoholic steatohepatitis (NASH): Secondary | ICD-10-CM | POA: Diagnosis not present

## 2017-06-23 DIAGNOSIS — M150 Primary generalized (osteo)arthritis: Secondary | ICD-10-CM | POA: Diagnosis not present

## 2017-06-27 DIAGNOSIS — S83281D Other tear of lateral meniscus, current injury, right knee, subsequent encounter: Secondary | ICD-10-CM | POA: Diagnosis not present

## 2017-06-28 DIAGNOSIS — M25661 Stiffness of right knee, not elsewhere classified: Secondary | ICD-10-CM | POA: Diagnosis not present

## 2017-06-28 DIAGNOSIS — R262 Difficulty in walking, not elsewhere classified: Secondary | ICD-10-CM | POA: Diagnosis not present

## 2017-06-28 DIAGNOSIS — M25561 Pain in right knee: Secondary | ICD-10-CM | POA: Diagnosis not present

## 2017-06-30 DIAGNOSIS — M25661 Stiffness of right knee, not elsewhere classified: Secondary | ICD-10-CM | POA: Diagnosis not present

## 2017-06-30 DIAGNOSIS — R262 Difficulty in walking, not elsewhere classified: Secondary | ICD-10-CM | POA: Diagnosis not present

## 2017-06-30 DIAGNOSIS — M25561 Pain in right knee: Secondary | ICD-10-CM | POA: Diagnosis not present

## 2017-07-05 DIAGNOSIS — R262 Difficulty in walking, not elsewhere classified: Secondary | ICD-10-CM | POA: Diagnosis not present

## 2017-07-05 DIAGNOSIS — M25561 Pain in right knee: Secondary | ICD-10-CM | POA: Diagnosis not present

## 2017-07-05 DIAGNOSIS — M25661 Stiffness of right knee, not elsewhere classified: Secondary | ICD-10-CM | POA: Diagnosis not present

## 2017-07-07 DIAGNOSIS — M25561 Pain in right knee: Secondary | ICD-10-CM | POA: Diagnosis not present

## 2017-07-07 DIAGNOSIS — M25661 Stiffness of right knee, not elsewhere classified: Secondary | ICD-10-CM | POA: Diagnosis not present

## 2017-07-07 DIAGNOSIS — R262 Difficulty in walking, not elsewhere classified: Secondary | ICD-10-CM | POA: Diagnosis not present

## 2017-07-12 DIAGNOSIS — M25561 Pain in right knee: Secondary | ICD-10-CM | POA: Diagnosis not present

## 2017-07-12 DIAGNOSIS — R262 Difficulty in walking, not elsewhere classified: Secondary | ICD-10-CM | POA: Diagnosis not present

## 2017-07-12 DIAGNOSIS — M25661 Stiffness of right knee, not elsewhere classified: Secondary | ICD-10-CM | POA: Diagnosis not present

## 2017-07-14 DIAGNOSIS — R262 Difficulty in walking, not elsewhere classified: Secondary | ICD-10-CM | POA: Diagnosis not present

## 2017-07-14 DIAGNOSIS — M25561 Pain in right knee: Secondary | ICD-10-CM | POA: Diagnosis not present

## 2017-07-14 DIAGNOSIS — M25661 Stiffness of right knee, not elsewhere classified: Secondary | ICD-10-CM | POA: Diagnosis not present

## 2017-07-19 DIAGNOSIS — R262 Difficulty in walking, not elsewhere classified: Secondary | ICD-10-CM | POA: Diagnosis not present

## 2017-07-19 DIAGNOSIS — M25561 Pain in right knee: Secondary | ICD-10-CM | POA: Diagnosis not present

## 2017-07-19 DIAGNOSIS — M25661 Stiffness of right knee, not elsewhere classified: Secondary | ICD-10-CM | POA: Diagnosis not present

## 2017-07-20 DIAGNOSIS — J069 Acute upper respiratory infection, unspecified: Secondary | ICD-10-CM | POA: Diagnosis not present

## 2017-07-20 DIAGNOSIS — H9202 Otalgia, left ear: Secondary | ICD-10-CM | POA: Diagnosis not present

## 2017-07-21 DIAGNOSIS — R262 Difficulty in walking, not elsewhere classified: Secondary | ICD-10-CM | POA: Diagnosis not present

## 2017-07-21 DIAGNOSIS — M25661 Stiffness of right knee, not elsewhere classified: Secondary | ICD-10-CM | POA: Diagnosis not present

## 2017-07-21 DIAGNOSIS — M25561 Pain in right knee: Secondary | ICD-10-CM | POA: Diagnosis not present

## 2017-07-25 DIAGNOSIS — M25661 Stiffness of right knee, not elsewhere classified: Secondary | ICD-10-CM | POA: Diagnosis not present

## 2017-07-25 DIAGNOSIS — R262 Difficulty in walking, not elsewhere classified: Secondary | ICD-10-CM | POA: Diagnosis not present

## 2017-07-25 DIAGNOSIS — M25561 Pain in right knee: Secondary | ICD-10-CM | POA: Diagnosis not present

## 2017-07-28 DIAGNOSIS — M25661 Stiffness of right knee, not elsewhere classified: Secondary | ICD-10-CM | POA: Diagnosis not present

## 2017-07-28 DIAGNOSIS — R262 Difficulty in walking, not elsewhere classified: Secondary | ICD-10-CM | POA: Diagnosis not present

## 2017-07-28 DIAGNOSIS — M25561 Pain in right knee: Secondary | ICD-10-CM | POA: Diagnosis not present

## 2017-08-02 DIAGNOSIS — R262 Difficulty in walking, not elsewhere classified: Secondary | ICD-10-CM | POA: Diagnosis not present

## 2017-08-02 DIAGNOSIS — M25561 Pain in right knee: Secondary | ICD-10-CM | POA: Diagnosis not present

## 2017-08-02 DIAGNOSIS — M25661 Stiffness of right knee, not elsewhere classified: Secondary | ICD-10-CM | POA: Diagnosis not present

## 2017-08-04 DIAGNOSIS — M25661 Stiffness of right knee, not elsewhere classified: Secondary | ICD-10-CM | POA: Diagnosis not present

## 2017-08-04 DIAGNOSIS — M25561 Pain in right knee: Secondary | ICD-10-CM | POA: Diagnosis not present

## 2017-08-04 DIAGNOSIS — R262 Difficulty in walking, not elsewhere classified: Secondary | ICD-10-CM | POA: Diagnosis not present

## 2017-08-08 DIAGNOSIS — M25661 Stiffness of right knee, not elsewhere classified: Secondary | ICD-10-CM | POA: Diagnosis not present

## 2017-08-08 DIAGNOSIS — R262 Difficulty in walking, not elsewhere classified: Secondary | ICD-10-CM | POA: Diagnosis not present

## 2017-08-08 DIAGNOSIS — M25561 Pain in right knee: Secondary | ICD-10-CM | POA: Diagnosis not present

## 2017-08-11 DIAGNOSIS — M25561 Pain in right knee: Secondary | ICD-10-CM | POA: Diagnosis not present

## 2017-08-11 DIAGNOSIS — M25661 Stiffness of right knee, not elsewhere classified: Secondary | ICD-10-CM | POA: Diagnosis not present

## 2017-08-11 DIAGNOSIS — R262 Difficulty in walking, not elsewhere classified: Secondary | ICD-10-CM | POA: Diagnosis not present

## 2017-08-16 DIAGNOSIS — M25561 Pain in right knee: Secondary | ICD-10-CM | POA: Diagnosis not present

## 2017-08-16 DIAGNOSIS — M25661 Stiffness of right knee, not elsewhere classified: Secondary | ICD-10-CM | POA: Diagnosis not present

## 2017-08-16 DIAGNOSIS — R262 Difficulty in walking, not elsewhere classified: Secondary | ICD-10-CM | POA: Diagnosis not present

## 2017-08-18 DIAGNOSIS — M25661 Stiffness of right knee, not elsewhere classified: Secondary | ICD-10-CM | POA: Diagnosis not present

## 2017-08-18 DIAGNOSIS — M25561 Pain in right knee: Secondary | ICD-10-CM | POA: Diagnosis not present

## 2017-08-18 DIAGNOSIS — R262 Difficulty in walking, not elsewhere classified: Secondary | ICD-10-CM | POA: Diagnosis not present

## 2017-08-22 DIAGNOSIS — M25661 Stiffness of right knee, not elsewhere classified: Secondary | ICD-10-CM | POA: Diagnosis not present

## 2017-08-22 DIAGNOSIS — R262 Difficulty in walking, not elsewhere classified: Secondary | ICD-10-CM | POA: Diagnosis not present

## 2017-08-22 DIAGNOSIS — M25561 Pain in right knee: Secondary | ICD-10-CM | POA: Diagnosis not present

## 2017-10-04 DIAGNOSIS — M25561 Pain in right knee: Secondary | ICD-10-CM | POA: Diagnosis not present

## 2017-11-02 DIAGNOSIS — L918 Other hypertrophic disorders of the skin: Secondary | ICD-10-CM | POA: Diagnosis not present

## 2017-11-22 DIAGNOSIS — K7581 Nonalcoholic steatohepatitis (NASH): Secondary | ICD-10-CM | POA: Diagnosis not present

## 2017-11-22 DIAGNOSIS — E7800 Pure hypercholesterolemia, unspecified: Secondary | ICD-10-CM | POA: Diagnosis not present

## 2017-11-22 DIAGNOSIS — Z96651 Presence of right artificial knee joint: Secondary | ICD-10-CM | POA: Diagnosis not present

## 2017-11-22 DIAGNOSIS — Z0000 Encounter for general adult medical examination without abnormal findings: Secondary | ICD-10-CM | POA: Diagnosis not present

## 2017-11-24 DIAGNOSIS — K7581 Nonalcoholic steatohepatitis (NASH): Secondary | ICD-10-CM | POA: Diagnosis not present

## 2017-11-24 DIAGNOSIS — E7800 Pure hypercholesterolemia, unspecified: Secondary | ICD-10-CM | POA: Diagnosis not present

## 2017-11-24 DIAGNOSIS — Z125 Encounter for screening for malignant neoplasm of prostate: Secondary | ICD-10-CM | POA: Diagnosis not present

## 2017-11-24 DIAGNOSIS — Z0000 Encounter for general adult medical examination without abnormal findings: Secondary | ICD-10-CM | POA: Diagnosis not present

## 2017-12-10 IMAGING — NM NM HEPATOBILIARY IMAGE, INC GB
1 series · 6 of 6 positions shown · non-contrast
Comparison: MRI from 12/30/2016.

CLINICAL DATA: Cholelithiasis with right upper quadrant pain.

EXAM:
NUCLEAR MEDICINE HEPATOBILIARY IMAGING
TECHNIQUE: Sequential images of the abdomen were obtained [DATE] minutes
following intravenous administration of radiopharmaceutical.
RADIOPHARMACEUTICALS:  5.1 mCi Nc-11m  Choletec IV

[Series 1000: hepatobiliary scan · 9.59mm/px · 6 of 60 frames shown]
[frame 6/60]
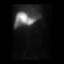
[frame 16/60]
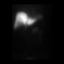
[frame 26/60]
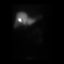
[frame 36/60]
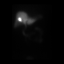
[frame 46/60]
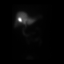
[frame 56/60]
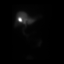

[6 of 6 positions shown; findings below may reference images not displayed]

FINDINGS: Prompt uptake and biliary excretion of activity by the liver is
seen. Gallbladder activity is visualized by 20-25 minutes,
consistent with patency of cystic duct. Biliary activity passes into
small bowel, consistent with patent common bile duct.
IMPRESSION: Normal hepatobiliary patency scan. No evidence for cystic duct
obstruction.

## 2018-06-26 DIAGNOSIS — D044 Carcinoma in situ of skin of scalp and neck: Secondary | ICD-10-CM | POA: Diagnosis not present

## 2018-06-26 DIAGNOSIS — D225 Melanocytic nevi of trunk: Secondary | ICD-10-CM | POA: Diagnosis not present

## 2018-06-26 DIAGNOSIS — L300 Nummular dermatitis: Secondary | ICD-10-CM | POA: Diagnosis not present

## 2018-06-26 DIAGNOSIS — D485 Neoplasm of uncertain behavior of skin: Secondary | ICD-10-CM | POA: Diagnosis not present

## 2018-06-26 DIAGNOSIS — L738 Other specified follicular disorders: Secondary | ICD-10-CM | POA: Diagnosis not present

## 2018-07-20 DIAGNOSIS — D044 Carcinoma in situ of skin of scalp and neck: Secondary | ICD-10-CM | POA: Diagnosis not present

## 2021-02-10 ENCOUNTER — Encounter (HOSPITAL_COMMUNITY): Payer: Self-pay | Admitting: Orthopedic Surgery
